# Patient Record
Sex: Female | Born: 1985 | Race: White | Hispanic: No | Marital: Married | Smoking: Never smoker
Health system: Southern US, Community
[De-identification: ages and names within clinical notes are randomized; demographics above are authoritative.]

## PROBLEM LIST (undated history)

## (undated) DIAGNOSIS — K5909 Other constipation: Secondary | ICD-10-CM

## (undated) DIAGNOSIS — N941 Unspecified dyspareunia: Secondary | ICD-10-CM

## (undated) DIAGNOSIS — I7300 Raynaud's syndrome without gangrene: Secondary | ICD-10-CM

## (undated) DIAGNOSIS — K589 Irritable bowel syndrome without diarrhea: Secondary | ICD-10-CM

## (undated) DIAGNOSIS — R2231 Localized swelling, mass and lump, right upper limb: Secondary | ICD-10-CM

## (undated) HISTORY — PX: FINGER SURGERY: SHX640

## (undated) HISTORY — DX: Raynaud's syndrome without gangrene: I73.00

## (undated) HISTORY — DX: Irritable bowel syndrome without diarrhea: K58.9

## (undated) HISTORY — DX: Unspecified dyspareunia: N94.10

## (undated) HISTORY — PX: WISDOM TOOTH EXTRACTION: SHX21

## (undated) HISTORY — DX: Other constipation: K59.09

---

## 2012-11-24 ENCOUNTER — Other Ambulatory Visit: Payer: Self-pay | Admitting: Orthopedic Surgery

## 2012-12-06 DIAGNOSIS — R2231 Localized swelling, mass and lump, right upper limb: Secondary | ICD-10-CM

## 2012-12-06 HISTORY — DX: Localized swelling, mass and lump, right upper limb: R22.31

## 2012-12-11 ENCOUNTER — Encounter (HOSPITAL_BASED_OUTPATIENT_CLINIC_OR_DEPARTMENT_OTHER): Payer: Self-pay | Admitting: *Deleted

## 2012-12-18 ENCOUNTER — Ambulatory Visit (HOSPITAL_BASED_OUTPATIENT_CLINIC_OR_DEPARTMENT_OTHER): Payer: 59 | Admitting: Anesthesiology

## 2012-12-18 ENCOUNTER — Encounter (HOSPITAL_BASED_OUTPATIENT_CLINIC_OR_DEPARTMENT_OTHER)
Admission: RE | Disposition: A | Discharge status: Home / Self Care | Payer: Self-pay | Source: Ambulatory Visit | Attending: Orthopedic Surgery

## 2012-12-18 ENCOUNTER — Encounter (HOSPITAL_BASED_OUTPATIENT_CLINIC_OR_DEPARTMENT_OTHER): Payer: Self-pay | Admitting: Anesthesiology

## 2012-12-18 ENCOUNTER — Encounter (HOSPITAL_BASED_OUTPATIENT_CLINIC_OR_DEPARTMENT_OTHER): Payer: Self-pay | Admitting: *Deleted

## 2012-12-18 ENCOUNTER — Ambulatory Visit (HOSPITAL_BASED_OUTPATIENT_CLINIC_OR_DEPARTMENT_OTHER)
Admission: RE | Admit: 2012-12-18 | Discharge: 2012-12-18 | Disposition: A | Discharge status: Home / Self Care | Payer: 59 | Source: Ambulatory Visit | Attending: Orthopedic Surgery | Admitting: Orthopedic Surgery

## 2012-12-18 DIAGNOSIS — M659 Unspecified synovitis and tenosynovitis, unspecified site: Secondary | ICD-10-CM | POA: Insufficient documentation

## 2012-12-18 DIAGNOSIS — L980 Pyogenic granuloma: Secondary | ICD-10-CM | POA: Insufficient documentation

## 2012-12-18 HISTORY — DX: Localized swelling, mass and lump, right upper limb: R22.31

## 2012-12-18 SURGERY — EXCISION METACARPAL MASS
Anesthesia: General | Site: Hand | Laterality: Right | Wound class: Clean

## 2012-12-18 MED ORDER — LACTATED RINGERS IV SOLN
INTRAVENOUS | Status: DC
  Administered 2012-12-18: 12:00:00 via INTRAVENOUS

## 2012-12-18 MED ORDER — OXYCODONE HCL 5 MG/5ML PO SOLN: 5.0000 mg | Freq: Once | ORAL | Status: DC | PRN

## 2012-12-18 MED ORDER — PROPOFOL 10 MG/ML IV BOLUS
INTRAVENOUS | Status: DC | PRN
  Administered 2012-12-18: 200 mg via INTRAVENOUS

## 2012-12-18 MED ORDER — CHLORHEXIDINE GLUCONATE 4 % EX LIQD: 60.0000 mL | Freq: Once | CUTANEOUS | Status: DC

## 2012-12-18 MED ORDER — FENTANYL CITRATE 0.05 MG/ML IJ SOLN: 50.0000 ug | INTRAMUSCULAR | Status: DC | PRN

## 2012-12-18 MED ORDER — MIDAZOLAM HCL 2 MG/ML PO SYRP: 12.0000 mg | ORAL_SOLUTION | Freq: Once | ORAL | Status: DC | PRN

## 2012-12-18 MED ORDER — MIDAZOLAM HCL 5 MG/5ML IJ SOLN
INTRAMUSCULAR | Status: DC | PRN
  Administered 2012-12-18: 2 mg via INTRAVENOUS

## 2012-12-18 MED ORDER — ONDANSETRON HCL 4 MG/2ML IJ SOLN
INTRAMUSCULAR | Status: DC | PRN
  Administered 2012-12-18: 4 mg via INTRAVENOUS

## 2012-12-18 MED ORDER — FENTANYL CITRATE 0.05 MG/ML IJ SOLN
INTRAMUSCULAR | Status: DC | PRN
  Administered 2012-12-18: 50 ug via INTRAVENOUS

## 2012-12-18 MED ORDER — PROMETHAZINE HCL 25 MG/ML IJ SOLN: 6.2500 mg | INTRAMUSCULAR | Status: DC | PRN

## 2012-12-18 MED ORDER — OXYCODONE HCL 5 MG PO TABS: 5.0000 mg | ORAL_TABLET | Freq: Once | ORAL | Status: DC | PRN

## 2012-12-18 MED ORDER — 0.9 % SODIUM CHLORIDE (POUR BTL) OPTIME
TOPICAL | Status: DC | PRN
  Administered 2012-12-18: 250 mL

## 2012-12-18 MED ORDER — MIDAZOLAM HCL 2 MG/2ML IJ SOLN: 1.0000 mg | INTRAMUSCULAR | Status: DC | PRN

## 2012-12-18 MED ORDER — HYDROCODONE-ACETAMINOPHEN 5-325 MG PO TABS: 1.0000 | ORAL_TABLET | Freq: Four times a day (QID) | ORAL | Status: DC | PRN

## 2012-12-18 MED ORDER — DEXAMETHASONE SODIUM PHOSPHATE 4 MG/ML IJ SOLN
INTRAMUSCULAR | Status: DC | PRN
  Administered 2012-12-18: 8 mg via INTRAVENOUS

## 2012-12-18 MED ORDER — CEFAZOLIN SODIUM-DEXTROSE 2-3 GM-% IV SOLR
2.0000 g | INTRAVENOUS | Status: AC
  Administered 2012-12-18: 2 g via INTRAVENOUS

## 2012-12-18 MED ORDER — BUPIVACAINE HCL (PF) 0.25 % IJ SOLN
INTRAMUSCULAR | Status: DC | PRN
  Administered 2012-12-18: 8 mL

## 2012-12-18 MED ORDER — LIDOCAINE HCL (CARDIAC) 20 MG/ML IV SOLN
INTRAVENOUS | Status: DC | PRN
  Administered 2012-12-18: 50 mg via INTRAVENOUS

## 2012-12-18 MED ORDER — HYDROMORPHONE HCL PF 1 MG/ML IJ SOLN: 0.2500 mg | INTRAMUSCULAR | Status: DC | PRN

## 2012-12-18 MED ORDER — EPHEDRINE SULFATE 50 MG/ML IJ SOLN
INTRAMUSCULAR | Status: DC | PRN
  Administered 2012-12-18: 10 mg via INTRAVENOUS

## 2012-12-18 SURGICAL SUPPLY — 52 items
BANDAGE COBAN STERILE 2 (GAUZE/BANDAGES/DRESSINGS) IMPLANT
BANDAGE GAUZE ELAST BULKY 4 IN (GAUZE/BANDAGES/DRESSINGS) IMPLANT
BLADE MINI RND TIP GREEN BEAV (BLADE) ×2 IMPLANT
BLADE SURG 15 STRL LF DISP TIS (BLADE) ×1 IMPLANT
BLADE SURG 15 STRL SS (BLADE) ×1
BNDG COHESIVE 1X5 TAN STRL LF (GAUZE/BANDAGES/DRESSINGS) ×2 IMPLANT
BNDG COHESIVE 3X5 TAN STRL LF (GAUZE/BANDAGES/DRESSINGS) IMPLANT
BNDG ESMARK 4X9 LF (GAUZE/BANDAGES/DRESSINGS) ×2 IMPLANT
CHLORAPREP W/TINT 26ML (MISCELLANEOUS) ×2 IMPLANT
CLOTH BEACON ORANGE TIMEOUT ST (SAFETY) ×2 IMPLANT
CORDS BIPOLAR (ELECTRODE) ×2 IMPLANT
COVER MAYO STAND STRL (DRAPES) ×2 IMPLANT
COVER TABLE BACK 60X90 (DRAPES) ×2 IMPLANT
CUFF TOURNIQUET SINGLE 18IN (TOURNIQUET CUFF) ×2 IMPLANT
DECANTER SPIKE VIAL GLASS SM (MISCELLANEOUS) IMPLANT
DRAIN PENROSE 1/2X12 LTX STRL (WOUND CARE) IMPLANT
DRAPE EXTREMITY T 121X128X90 (DRAPE) ×2 IMPLANT
DRAPE SURG 17X23 STRL (DRAPES) ×2 IMPLANT
GAUZE XEROFORM 1X8 LF (GAUZE/BANDAGES/DRESSINGS) ×2 IMPLANT
GLOVE BIO SURGEON STRL SZ 6.5 (GLOVE) ×2 IMPLANT
GLOVE BIOGEL PI IND STRL 7.0 (GLOVE) ×1 IMPLANT
GLOVE BIOGEL PI IND STRL 8.5 (GLOVE) ×1 IMPLANT
GLOVE BIOGEL PI INDICATOR 7.0 (GLOVE) ×1
GLOVE BIOGEL PI INDICATOR 8.5 (GLOVE) ×1
GLOVE SURG ORTHO 8.0 STRL STRW (GLOVE) ×2 IMPLANT
GOWN BRE IMP PREV XXLGXLNG (GOWN DISPOSABLE) ×2 IMPLANT
GOWN PREVENTION PLUS XLARGE (GOWN DISPOSABLE) ×2 IMPLANT
NDL SAFETY ECLIPSE 18X1.5 (NEEDLE) IMPLANT
NEEDLE 27GAX1X1/2 (NEEDLE) ×2 IMPLANT
NEEDLE HYPO 18GX1.5 SHARP (NEEDLE)
NS IRRIG 1000ML POUR BTL (IV SOLUTION) ×2 IMPLANT
PACK BASIN DAY SURGERY FS (CUSTOM PROCEDURE TRAY) ×2 IMPLANT
PAD CAST 3X4 CTTN HI CHSV (CAST SUPPLIES) IMPLANT
PADDING CAST ABS 3INX4YD NS (CAST SUPPLIES)
PADDING CAST ABS 4INX4YD NS (CAST SUPPLIES) ×1
PADDING CAST ABS COTTON 3X4 (CAST SUPPLIES) IMPLANT
PADDING CAST ABS COTTON 4X4 ST (CAST SUPPLIES) ×1 IMPLANT
PADDING CAST COTTON 3X4 STRL (CAST SUPPLIES)
SPLINT FNGR PLAIN END 5/8X3.25 (CAST SUPPLIES) ×1 IMPLANT
SPLINT PLASTALUME 3 1/4 (CAST SUPPLIES) ×2
SPLINT PLASTER CAST XFAST 3X15 (CAST SUPPLIES) IMPLANT
SPLINT PLASTER XTRA FASTSET 3X (CAST SUPPLIES)
SPONGE GAUZE 4X4 12PLY (GAUZE/BANDAGES/DRESSINGS) ×2 IMPLANT
STOCKINETTE 4X48 STRL (DRAPES) ×2 IMPLANT
SUT MERSILENE 5 0 P 3 (SUTURE) ×2 IMPLANT
SUT VIC AB 4-0 P2 18 (SUTURE) IMPLANT
SUT VICRYL RAPID 5 0 P 3 (SUTURE) ×2 IMPLANT
SUT VICRYL RAPIDE 4/0 PS 2 (SUTURE) IMPLANT
SYR BULB 3OZ (MISCELLANEOUS) ×2 IMPLANT
SYR CONTROL 10ML LL (SYRINGE) ×2 IMPLANT
TOWEL OR 17X24 6PK STRL BLUE (TOWEL DISPOSABLE) ×2 IMPLANT
UNDERPAD 30X30 INCONTINENT (UNDERPADS AND DIAPERS) ×2 IMPLANT

## 2012-12-18 NOTE — Brief Op Note (Signed)
12/18/2012  1:45 PM  PATIENT:  Maria Cohen  27 y.o. female  PRE-OPERATIVE DIAGNOSIS:  MASS RIGHT MIDDLE FINGER  POST-OPERATIVE DIAGNOSIS:  MASS RIGHT MIDDLE FINGER  PROCEDURE:  Procedure(s): EXCISION MASS RIGHT MIDDLE FINGER, POSSIBLE DEBRIDEMENT PROXIMAL INTERPHALANGEAL (Right)  SURGEON:  Surgeon(s) and Role:    * Nicki Reaper, MD - Primary  PHYSICIAN ASSISTANT:   ASSISTANTS: none   ANESTHESIA:   local and general  EBL:     BLOOD ADMINISTERED:none  DRAINS: none   LOCAL MEDICATIONS USED:  MARCAINE     SPECIMEN:  Excision  DISPOSITION OF SPECIMEN:  PATHOLOGY  COUNTS:  YES  TOURNIQUET:   Total Tourniquet Time Documented: Upper Arm (Right) - 22 minutes Total: Upper Arm (Right) - 22 minutes   DICTATION: .Other Dictation: Dictation Number [id number]  PLAN OF CARE: Discharge to home after PACU  PATIENT DISPOSITION:  PACU - hemodynamically stable.

## 2012-12-18 NOTE — Anesthesia Preprocedure Evaluation (Signed)
Anesthesia Evaluation  Patient identified by MRN, date of birth, ID band Patient awake    Reviewed: Allergy & Precautions, H&P , NPO status , Patient's Chart, lab work & pertinent test results  Airway Mallampati: I TM Distance: >3 FB Neck ROM: Full    Dental   Pulmonary  breath sounds clear to auscultation        Cardiovascular Rhythm:Regular Rate:Normal     Neuro/Psych    GI/Hepatic   Endo/Other    Renal/GU      Musculoskeletal   Abdominal   Peds  Hematology   Anesthesia Other Findings   Reproductive/Obstetrics                           Anesthesia Physical Anesthesia Plan  ASA: I  Anesthesia Plan: General   Post-op Pain Management:    Induction: Intravenous  Airway Management Planned: LMA  Additional Equipment:   Intra-op Plan:   Post-operative Plan: Extubation in OR  Informed Consent: I have reviewed the patients History and Physical, chart, labs and discussed the procedure including the risks, benefits and alternatives for the proposed anesthesia with the patient or authorized representative who has indicated his/her understanding and acceptance.     Plan Discussed with: CRNA and Surgeon  Anesthesia Plan Comments:         Anesthesia Quick Evaluation  

## 2012-12-18 NOTE — Anesthesia Postprocedure Evaluation (Signed)
  Anesthesia Post-op Note  Patient: Maria Cohen  Procedure(s) Performed: Procedure(s): EXCISION MASS RIGHT MIDDLE FINGER, POSSIBLE DEBRIDEMENT PROXIMAL INTERPHALANGEAL (Right)  Patient Location: PACU  Anesthesia Type:General  Level of Consciousness: awake and alert   Airway and Oxygen Therapy: Patient Spontanous Breathing  Post-op Pain: mild  Post-op Assessment: Post-op Vital signs reviewed, Patient's Cardiovascular Status Stable, Respiratory Function Stable, Patent Airway, No signs of Nausea or vomiting and Pain level controlled  Post-op Vital Signs: stable  Complications: No apparent anesthesia complications

## 2012-12-18 NOTE — Transfer of Care (Signed)
Immediate Anesthesia Transfer of Care Note  Patient: Maria Cohen  Procedure(s) Performed: Procedure(s): EXCISION MASS RIGHT MIDDLE FINGER, POSSIBLE DEBRIDEMENT PROXIMAL INTERPHALANGEAL (Right)  Patient Location: PACU  Anesthesia Type:General  Level of Consciousness: awake, alert  and oriented  Airway & Oxygen Therapy: Patient Spontanous Breathing and Patient connected to face mask oxygen  Post-op Assessment: Report given to PACU RN and Post -op Vital signs reviewed and stable  Post vital signs: Reviewed and stable  Complications: No apparent anesthesia complications

## 2012-12-18 NOTE — H&P (Signed)
Maria Cohen is a 27 year-old right-hand dominant female  with a mass in the dorsal aspect of her right middle finger. This has been present since January.  She recalls no history of injury, but states that her finger was swollen at the time, became stiff.  She has been worked up for [employer], laboratory work has been negative.  She has no history of diabetes, thyroid problems, arthritis or gout.  There is a family history of diabetes.  She has been taking meloxicam with some relief.  She complains of the swelling and mass over the PIP joint of her right middle finger. 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    ALLERGIES:     None.  MEDICATIONS:     Birth control, meloxicam.  SURGICAL HISTORY:    None.  FAMILY MEDICAL HISTORY:  Positive for arthritis.      SOCIAL HISTORY:     She does not smoke, drinks socially, she is married and works as a Char[employer] fundraiser.  REVIEW OF SYSTEMS:    Positive for contacts, the nodules, otherwise negative 14 points. Maria Cohen is an 27 y.o. female.   Chief Complaint: mass RMF HPI: see above  Past Medical History  Diagnosis Date  . Mass of finger of right hand 12/2012    middle finger    Past Surgical History  Procedure Laterality Date  . Wisdom tooth extraction      History reviewed. No pertinent family history. Social History:  reports that she has never smoked. She has never used smokeless tobacco. She reports that  drinks alcohol. She reports that she does not use illicit drugs.  Allergies: No Known Allergies  Medications Prior to Admission  Medication Sig Dispense Refill  . fish oil-omega-3 fatty acids 1000 MG capsule Take 2 g by mouth daily.      . meloxicam (MOBIC) 15 MG tablet Take 15 mg by mouth daily.      . norethindrone-ethinyl estradiol (OVCON-35,BALZIVA,BRIELLYN) 0.4-35 MG-MCG tablet Take 1 tablet by mouth daily.        Results for orders placed during the hospital  encounter of 12/18/12 (from the past 48 hour(s))  POCT HEMOGLOBIN-HEMACUE     Status: None   Collection Time    12/18/12 11:34 AM      Result Value Range   Hemoglobin 12.7  12.0 - 15.0 g/dL    No results found.   Pertinent items are noted in HPI.  Blood pressure 102/68, pulse 69, temperature 98.4 F (36.9 C), temperature source Oral, resp. rate 20, height 5\' 5"  (1.651 m), weight 65.772 kg (145 lb), last menstrual period 12/07/2012, SpO2 100.00%.  General appearance: alert, cooperative and appears stated age Head: Normocephalic, without obvious abnormality Neck: no JVD Resp: clear to auscultation bilaterally Cardio: regular rate and rhythm, S1, S2 normal, no murmur, click, rub or gallop GI: soft, non-tender; bowel sounds normal; no masses,  no organomegaly Extremities: extremities normal, atraumatic, no cyanosis or edema Pulses: 2+ and symmetric Skin: Skin color, texture, turgor normal. No rashes or lesions Neurologic: Grossly normal Incision/Wound: na  Assessment/Plan RADIOGRAPHS:     X-rays are negative.  RECOMMENDATIONS/PLAN:   We have discussed this with her.  She would like to have this biopsied.  She is aware of the risks and complications including infection, recurrence, injury to arteries, nerves, tendons.  She would like to proceed with the possibility of debridement of  the joint should this be from the joint.    She is scheduled for excision mass right middle finger as an outpatient under regional anesthesia.  Minami Arriaga R 12/18/2012, 12:46 PM

## 2012-12-18 NOTE — Anesthesia Procedure Notes (Signed)
Procedure Name: LMA Insertion Date/Time: 12/18/2012 1:09 PM Performed by: Zenia Resides D Pre-anesthesia Checklist: Patient identified, Emergency Drugs available, Suction available and Patient being monitored Patient Re-evaluated:Patient Re-evaluated prior to inductionOxygen Delivery Method: Circle System Utilized Preoxygenation: Pre-oxygenation with 100% oxygen Intubation Type: IV induction Ventilation: Mask ventilation without difficulty LMA: LMA inserted LMA Size: 4.0 Number of attempts: 1 Airway Equipment and Method: bite block Placement Confirmation: positive ETCO2 Tube secured with: Tape Dental Injury: Teeth and Oropharynx as per pre-operative assessment

## 2012-12-18 NOTE — Op Note (Signed)
Dictation Number [id number]

## 2012-12-19 NOTE — Op Note (Signed)
NAME:  Maria Cohen, KIHN NO.:  [id number]  MEDICAL RECORD NO.:  [id number]  LOCATION:                                 FACILITY:  PHYSICIAN:  Cindee Salt, M.D.            DATE OF BIRTH:  DATE OF PROCEDURE:  12/18/2012 DATE OF DISCHARGE:                              OPERATIVE REPORT   PREOPERATIVE DIAGNOSIS:  Mass, dorsal aspect right middle finger.  POSTOPERATIVE DIAGNOSIS:  Mass, dorsal aspect right middle finger.  OPERATION:  Excision of mass, synovectomy PIP joint, right middle finger.  SURGEON:  Cindee Salt, M.D.  ANESTHESIA:  General with metacarpal block.  ANESTHESIOLOGIST:  Bedelia Person, M.D.  HISTORY:  The patient is a 27 year old female with a history of mass over the PIP joint right middle finger.  This has become painful for her.  It has been present for approximately 4 months and enlarging.  She desirous of having this excised.  She is aware that there are the risks and complications of surgery including infection, recurrence injury to arteries, nerves, tendons, incomplete relief of symptoms, dystrophy, possibility of stiffness to the joint.  In the preoperative area, the patient was seen, the extremity was marked by both patient and surgeon. Antibiotic given.  DESCRIPTION OF PROCEDURE:  The patient was brought to the operating room where a general anesthetic was carried out without difficulty.  She was prepped using ChloraPrep in supine position with the right arm free.  A 3-minute dry time was allowed.  Time-out taken, confirmed the patient and procedure.  The limb was exsanguinated with an Esmarch bandage. Tourniquet placed on the upper arm, it was inflated to 250 mmHg.  A curvilinear incision was made over the PIP joint.  The proximal middle phalanges of the right middle finger carried down through subcutaneous tissue.  The mass was immediately encountered directly under the skin and this was excised with blunt and sharp dissection, sent to  Pathology. The joint was opened and that there was significant swelling of this. This was done on the radial aspect between the lateral and central slip. A complete synovectomy to the dorsal aspect of the joint was performed. The specimen was sent.  The wound was copiously irrigated with saline. The entrance into the joint through the extensor tendon was repaired with figure-of-eight 5-0 Mersilene sutures, and the skin closed with interrupted 5-0 Vicryl Rapide sutures.  A metacarpal block was then given with 0.25% Marcaine without epinephrine, 8 mL was used.  Sterile compressive dressing and splint to the finger applied.  On deflation of the tourniquet, remaining fingers pinked.  She was taken to the recovery room for observation in satisfactory condition.  She will be discharged home to return in 1 week on Vicodin.          ______________________________ Cindee Salt, M.D.     GK/MEDQ  D:  12/18/2012  T:  12/19/2012  Job:  [id number]  cc:   Debbora Dus, MD

## 2014-09-19 LAB — HM PAP SMEAR: HM PAP: NEGATIVE

## 2015-08-14 DIAGNOSIS — K5900 Constipation, unspecified: Secondary | ICD-10-CM | POA: Diagnosis not present

## 2015-09-27 DIAGNOSIS — Z01419 Encounter for gynecological examination (general) (routine) without abnormal findings: Secondary | ICD-10-CM | POA: Diagnosis not present

## 2015-09-27 DIAGNOSIS — Z3169 Encounter for other general counseling and advice on procreation: Secondary | ICD-10-CM | POA: Diagnosis not present

## 2015-09-27 DIAGNOSIS — Z3041 Encounter for surveillance of contraceptive pills: Secondary | ICD-10-CM | POA: Diagnosis not present

## 2015-10-10 MED FILL — LINZESS 145 MCG CAPSULE: 145 | 30 days supply | Qty: 30 | Fill #0

## 2015-11-06 MED FILL — LINZESS 145 MCG CAPSULE: 145 | 30 days supply | Qty: 30 | Fill #1

## 2015-12-06 MED FILL — LINZESS 145 MCG CAPSULE: 145 | 30 days supply | Qty: 30 | Fill #2

## 2016-01-03 MED FILL — LINZESS 145 MCG CAPSULE: 145 | 30 days supply | Qty: 30 | Fill #0 | Status: TO

## 2016-06-18 DIAGNOSIS — M2550 Pain in unspecified joint: Secondary | ICD-10-CM | POA: Diagnosis not present

## 2016-06-18 DIAGNOSIS — M064 Inflammatory polyarthropathy: Secondary | ICD-10-CM | POA: Diagnosis not present

## 2016-06-18 DIAGNOSIS — Z6823 Body mass index (BMI) 23.0-23.9, adult: Secondary | ICD-10-CM | POA: Diagnosis not present

## 2016-09-10 ENCOUNTER — Telehealth: Payer: Self-pay

## 2016-09-10 ENCOUNTER — Other Ambulatory Visit: Payer: Self-pay | Admitting: Obstetrics and Gynecology

## 2016-09-10 ENCOUNTER — Other Ambulatory Visit: Payer: Self-pay

## 2016-09-10 DIAGNOSIS — Z3041 Encounter for surveillance of contraceptive pills: Secondary | ICD-10-CM

## 2016-09-10 MED ORDER — NORETHINDRONE-ETH ESTRADIOL 0.4-35 MG-MCG PO TABS: 1.0000 | ORAL_TABLET | Freq: Every day | ORAL | 1 refills | Status: DC

## 2016-09-10 NOTE — Telephone Encounter (Signed)
LVM with information. Pt to fu prn.

## 2016-09-10 NOTE — Telephone Encounter (Signed)
Pt states this is her 3rd request for her BC refill, pt has appt in April, please send in refill and call to let her know its sent please cb [phone]

## 2016-09-10 NOTE — Telephone Encounter (Signed)
RN to notify pt we never got other RF requests. Rx eRxd just now.

## 2016-10-24 ENCOUNTER — Encounter: Payer: Self-pay | Admitting: Obstetrics and Gynecology

## 2016-10-24 ENCOUNTER — Ambulatory Visit (INDEPENDENT_AMBULATORY_CARE_PROVIDER_SITE_OTHER): Payer: 59 | Admitting: Obstetrics and Gynecology

## 2016-10-24 VITALS — BP 104/66 | HR 76 | Ht 65.0 in | Wt 139.0 lb

## 2016-10-24 DIAGNOSIS — Z3041 Encounter for surveillance of contraceptive pills: Secondary | ICD-10-CM

## 2016-10-24 DIAGNOSIS — Z01419 Encounter for gynecological examination (general) (routine) without abnormal findings: Secondary | ICD-10-CM

## 2016-10-24 NOTE — Progress Notes (Signed)
Chief Complaint  Patient presents with  . Gynecologic Exam     HPI:      [name] is a 31 y.o. G0P0000 who LMP was Patient's last menstrual period was 10/07/2016 (exact date)., presents today for her annual examination.  Her menses are regular every 28-30 days, lasting 5 days.  Dysmenorrhea mild, occurring first 1-2 days of flow. She does not have intermenstrual bleeding.  Sex activity: single partner, contraception - OCP (estrogen/progesterone). She may want to conceive 2019. Last Pap: September 19, 2014  Results were: no abnormalities /neg HPV DNA  Hx of STDs: none  There is a FH of breast cancer in her MGM, genetic testing not indicated. There is no FH of ovarian cancer. The patient does do self-breast exams.  Tobacco use: The patient denies current or previous tobacco use. Alcohol use: social drinker Exercise: very active  She does get adequate calcium and Vitamin D in her diet.   Past Medical History:  Diagnosis Date  . Dyspareunia in female   . IBS (irritable bowel syndrome)   . Mass of finger of right hand 12/2012   middle finger    Past Surgical History:  Procedure Laterality Date  . FINGER SURGERY     Rheumatoid nodule  . WISDOM TOOTH EXTRACTION      Family History  Problem Relation Age of Onset  . Osteopenia Mother   . COPD Father   . Lung cancer Father   . Breast cancer Maternal Grandmother 2    Social History   Social History  . Marital status: Married    Spouse name: N/A  . Number of children: N/A  . Years of education: N/A   Occupational History  . Not on file.   Social History Main Topics  . Smoking status: Never Smoker  . Smokeless tobacco: Never Used  . Alcohol use Yes     Comment: ocasionally  . Drug use: No  . Sexual activity: Yes    Birth control/ protection: Pill   Other Topics Concern  . Not on file   Social History Narrative  . No narrative on file     Current Outpatient Prescriptions:  .  BALZIVA 0.4-35  MG-MCG tablet, Take 1 tablet by mouth daily., Disp: , Rfl: 1  ROS:  Review of Systems  Constitutional: Negative for fever, malaise/fatigue and weight loss.  HENT: Negative for congestion, ear pain and sinus pain.   Respiratory: Negative for cough, shortness of breath and wheezing.   Cardiovascular: Negative for chest pain, orthopnea and leg swelling.  Gastrointestinal: Negative for constipation, diarrhea, nausea and vomiting.  Genitourinary: Negative for dysuria, frequency, hematuria and urgency.       Breast ROS: negative   Musculoskeletal: Negative for back pain, joint pain and myalgias.  Skin: Negative for itching and rash.  Neurological: Negative for dizziness, tingling, focal weakness and headaches.  Endo/Heme/Allergies: Negative for environmental allergies. Does not bruise/bleed easily.  Psychiatric/Behavioral: Negative for depression and suicidal ideas. The patient is not nervous/anxious and does not have insomnia.     Objective: BP 104/66 (BP Location: Left Arm, Patient Position: Sitting, Cuff Size: Normal)   Pulse 76   Ht 5\' 5"  (1.651 m)   Wt 139 lb (63 kg)   LMP 10/07/2016 (Exact Date)   BMI 23.13 kg/m    Physical Exam  Constitutional: She is oriented to person, place, and time. She appears well-developed and well-nourished.  Genitourinary: Vagina normal and uterus normal. No erythema or tenderness in  the vagina. No vaginal discharge found. Right adnexum does not display mass and does not display tenderness. Left adnexum does not display mass and does not display tenderness. Cervix does not exhibit motion tenderness or polyp. Uterus is not enlarged or tender.  Neck: Normal range of motion. No thyromegaly present.  Cardiovascular: Normal rate, regular rhythm and normal heart sounds.   No murmur heard. Pulmonary/Chest: Effort normal and breath sounds normal. Right breast exhibits no mass, no nipple discharge, no skin change and no tenderness. Left breast exhibits no mass,  no nipple discharge, no skin change and no tenderness.  Abdominal: Soft. There is no tenderness. There is no guarding.  Musculoskeletal: Normal range of motion.  Neurological: She is alert and oriented to person, place, and time. No cranial nerve deficit.  Psychiatric: She has a normal mood and affect. Her behavior is normal.  Vitals reviewed.   Assessment/Plan: Encounter for annual routine gynecological examination  Encounter for surveillance of contraceptive pills - OCP RF. Start PNVs 3 months before stopping OCPs.             GYN counsel adequate intake of calcium and vitamin D, diet and exercise     F/U  Return in about 1 year (around 10/24/2017).  Ayauna Mcnay B. Aspin Palomarez, PA-C 10/24/2016 5:09 PM

## 2016-11-03 ENCOUNTER — Other Ambulatory Visit: Payer: Self-pay | Admitting: Obstetrics and Gynecology

## 2016-11-03 DIAGNOSIS — Z3041 Encounter for surveillance of contraceptive pills: Secondary | ICD-10-CM

## 2017-10-07 ENCOUNTER — Other Ambulatory Visit: Payer: Self-pay | Admitting: Occupational Medicine

## 2017-10-08 LAB — CBC WITH DIFFERENTIAL/PLATELET
BASOS ABS: 39 {cells}/uL (ref 0–200)
Basophils Relative: 0.7 %
EOS ABS: 50 {cells}/uL (ref 15–500)
Eosinophils Relative: 0.9 %
HCT: 35.4 % (ref 35.0–45.0)
Hemoglobin: 12.4 g/dL (ref 11.7–15.5)
Lymphs Abs: 1865 cells/uL (ref 850–3900)
MCH: 33 pg (ref 27.0–33.0)
MCHC: 35 g/dL (ref 32.0–36.0)
MCV: 94.1 fL (ref 80.0–100.0)
MONOS PCT: 6.9 %
MPV: 10.5 fL (ref 7.5–12.5)
NEUTROS ABS: 3168 {cells}/uL (ref 1500–7800)
Neutrophils Relative %: 57.6 %
PLATELETS: 307 10*3/uL (ref 140–400)
RBC: 3.76 10*6/uL — ABNORMAL LOW (ref 3.80–5.10)
RDW: 12.1 % (ref 11.0–15.0)
Total Lymphocyte: 33.9 %
WBC mixed population: 380 cells/uL (ref 200–950)
WBC: 5.5 10*3/uL (ref 3.8–10.8)

## 2017-10-08 LAB — COMPLETE METABOLIC PANEL WITH GFR
AG Ratio: 1.8 (calc) (ref 1.0–2.5)
ALKALINE PHOSPHATASE (APISO): 25 U/L — AB (ref 33–115)
ALT: 6 U/L (ref 6–29)
AST: 14 U/L (ref 10–30)
Albumin: 4.2 g/dL (ref 3.6–5.1)
BILIRUBIN TOTAL: 0.5 mg/dL (ref 0.2–1.2)
BUN: 12 mg/dL (ref 7–25)
CHLORIDE: 106 mmol/L (ref 98–110)
CO2: 25 mmol/L (ref 20–32)
Calcium: 9.3 mg/dL (ref 8.6–10.2)
Creat: 0.96 mg/dL (ref 0.50–1.10)
GFR, EST AFRICAN AMERICAN: 90 mL/min/{1.73_m2} (ref >=60)
GFR, Est Non African American: 78 mL/min/{1.73_m2} (ref >=60)
GLUCOSE: 100 mg/dL — AB (ref 65–99)
Globulin: 2.4 g/dL (calc) (ref 1.9–3.7)
Potassium: 4.8 mmol/L (ref 3.5–5.3)
Sodium: 138 mmol/L (ref 135–146)
TOTAL PROTEIN: 6.6 g/dL (ref 6.1–8.1)

## 2017-10-08 LAB — LIPID PANEL W/REFLEX DIRECT LDL
Cholesterol: 177 mg/dL (ref <200)
HDL: 78 mg/dL (ref >50)
LDL CHOLESTEROL (CALC): 79 mg/dL
NON-HDL CHOLESTEROL (CALC): 99 mg/dL (ref <130)
TRIGLYCERIDES: 117 mg/dL (ref <150)
Total CHOL/HDL Ratio: 2.3 (calc) (ref <5.0)

## 2017-10-08 LAB — TSH: TSH: 1.61 m[IU]/L

## 2017-10-15 ENCOUNTER — Other Ambulatory Visit: Payer: Self-pay | Admitting: Obstetrics and Gynecology

## 2017-10-15 DIAGNOSIS — Z3041 Encounter for surveillance of contraceptive pills: Secondary | ICD-10-CM

## 2017-11-10 ENCOUNTER — Other Ambulatory Visit: Payer: Self-pay | Admitting: Obstetrics and Gynecology

## 2017-11-10 DIAGNOSIS — Z3041 Encounter for surveillance of contraceptive pills: Secondary | ICD-10-CM

## 2017-11-12 ENCOUNTER — Ambulatory Visit (INDEPENDENT_AMBULATORY_CARE_PROVIDER_SITE_OTHER): Payer: 59 | Admitting: Obstetrics and Gynecology

## 2017-11-12 ENCOUNTER — Encounter: Payer: Self-pay | Admitting: Obstetrics and Gynecology

## 2017-11-12 VITALS — BP 102/70 | HR 69 | Ht 65.0 in | Wt 142.0 lb

## 2017-11-12 DIAGNOSIS — Z3041 Encounter for surveillance of contraceptive pills: Secondary | ICD-10-CM

## 2017-11-12 DIAGNOSIS — Z1151 Encounter for screening for human papillomavirus (HPV): Secondary | ICD-10-CM

## 2017-11-12 DIAGNOSIS — Z01419 Encounter for gynecological examination (general) (routine) without abnormal findings: Secondary | ICD-10-CM | POA: Diagnosis not present

## 2017-11-12 DIAGNOSIS — Z124 Encounter for screening for malignant neoplasm of cervix: Secondary | ICD-10-CM | POA: Diagnosis not present

## 2017-11-12 MED ORDER — NORETHINDRONE-ETH ESTRADIOL 0.4-35 MG-MCG PO TABS: 1.0000 | ORAL_TABLET | Freq: Every day | ORAL | 3 refills | Status: DC

## 2017-11-12 NOTE — Patient Instructions (Signed)
I value your feedback and entrusting us with your care. If you get a [facility] patient survey, I would appreciate you taking the time to let us know about your experience today. Thank you! 

## 2017-11-12 NOTE — Progress Notes (Signed)
Chief Complaint  Patient presents with  . Gynecologic Exam     HPI:      [name] is a 32 y.o. G0P0000 who LMP was Patient's last menstrual period was 11/03/2017 (exact date)., presents today for her annual examination.  Her menses are regular every 28-30 days, lasting 5 days.  Dysmenorrhea mild, occurring first 1 day of flow, improved with ibup. She does not have intermenstrual bleeding.  Sex activity: single partner, contraception - OCP (estrogen/progesterone). She may want to conceive 2019. Last Pap: September 19, 2014  Results were: no abnormalities /neg HPV DNA  Hx of STDs: none  There is a FH of breast cancer in her MGM, genetic testing not indicated. There is no FH of ovarian cancer. The patient does do self-breast exams.  Tobacco use: The patient denies current or previous tobacco use. Alcohol use: social drinker Exercise: very active  She does get adequate calcium and Vitamin D in her diet.    Past Medical History:  Diagnosis Date  . Dyspareunia in female   . IBS (irritable bowel syndrome)   . Mass of finger of right hand 12/2012   middle finger    Past Surgical History:  Procedure Laterality Date  . FINGER SURGERY     Rheumatoid nodule  . WISDOM TOOTH EXTRACTION      Family History  Problem Relation Age of Onset  . Osteopenia Mother   . COPD Father   . Lung cancer Father   . Breast cancer Maternal Grandmother 39    Social History   Socioeconomic History  . Marital status: Married    Spouse name: Not on file  . Number of children: Not on file  . Years of education: Not on file  . Highest education level: Not on file  Occupational History  . Not on file  Social Needs  . Financial resource strain: Not on file  . Food insecurity:    Worry: Not on file    Inability: Not on file  . Transportation needs:    Medical: Not on file    Non-medical: Not on file  Tobacco Use  . Smoking status: Never Smoker  . Smokeless tobacco: Never  Used  Substance and Sexual Activity  . Alcohol use: Yes    Comment: ocasionally  . Drug use: No  . Sexual activity: Yes    Birth control/protection: Pill  Lifestyle  . Physical activity:    Days per week: Not on file    Minutes per session: Not on file  . Stress: Not on file  Relationships  . Social connections:    Talks on phone: Not on file    Gets together: Not on file    Attends religious service: Not on file    Active member of club or organization: Not on file    Attends meetings of clubs or organizations: Not on file    Relationship status: Not on file  . Intimate partner violence:    Fear of current or ex partner: Not on file    Emotionally abused: Not on file    Physically abused: Not on file    Forced sexual activity: Not on file  Other Topics Concern  . Not on file  Social History Narrative  . Not on file    No current outpatient medications on file prior to visit.   No current facility-administered medications on file prior to visit.       ROS:  Review of Systems  Constitutional: Negative for fatigue, fever and unexpected weight change.  Respiratory: Negative for cough, shortness of breath and wheezing.   Cardiovascular: Negative for chest pain, palpitations and leg swelling.  Gastrointestinal: Negative for blood in stool, constipation, diarrhea, nausea and vomiting.  Endocrine: Negative for cold intolerance, heat intolerance and polyuria.  Genitourinary: Negative for dyspareunia, dysuria, flank pain, frequency, genital sores, hematuria, menstrual problem, pelvic pain, urgency, vaginal bleeding, vaginal discharge and vaginal pain.  Musculoskeletal: Negative for back pain, joint swelling and myalgias.  Skin: Negative for rash.  Neurological: Negative for dizziness, syncope, light-headedness, numbness and headaches.  Hematological: Negative for adenopathy.  Psychiatric/Behavioral: Negative for agitation, confusion, sleep disturbance and suicidal ideas. The  patient is not nervous/anxious.      Objective: BP 102/70   Pulse 69   Ht 5\' 5"  (1.651 m)   Wt 142 lb (64.4 kg)   LMP 11/03/2017 (Exact Date)   BMI 23.63 kg/m    Physical Exam  Constitutional: She is oriented to person, place, and time. She appears well-developed and well-nourished.  Genitourinary: Vagina normal and uterus normal. There is no rash or tenderness on the right labia. There is no rash or tenderness on the left labia. No erythema or tenderness in the vagina. No vaginal discharge found. Right adnexum does not display mass and does not display tenderness. Left adnexum does not display mass and does not display tenderness. Cervix does not exhibit motion tenderness or polyp. Uterus is not enlarged or tender.  Neck: Normal range of motion. No thyromegaly present.  Cardiovascular: Normal rate, regular rhythm and normal heart sounds.  No murmur heard. Pulmonary/Chest: Effort normal and breath sounds normal. Right breast exhibits no mass, no nipple discharge, no skin change and no tenderness. Left breast exhibits no mass, no nipple discharge, no skin change and no tenderness.  Abdominal: Soft. There is no tenderness. There is no guarding.  Musculoskeletal: Normal range of motion.  Neurological: She is alert and oriented to person, place, and time. No cranial nerve deficit.  Psychiatric: She has a normal mood and affect. Her behavior is normal.  Vitals reviewed.   Assessment/Plan: Encounter for annual routine gynecological examination  Cervical cancer screening - Plan: IGP, Aptima HPV  Screening for HPV (human papillomavirus) - Plan: IGP, Aptima HPV  Encounter for surveillance of contraceptive pills - OCP RF. - Plan: norethindrone-ethinyl estradiol (BALZIVA) 0.4-35 MG-MCG tablet  Meds ordered this encounter  Medications  . norethindrone-ethinyl estradiol (BALZIVA) 0.4-35 MG-MCG tablet    Sig: Take 1 tablet by mouth daily.    Dispense:  84 tablet    Refill:  3    Order  Specific Question:   Supervising Provider    Answer:   Gae Dry [[id number]]             GYN counsel adequate intake of calcium and vitamin D, diet and exercise     F/U  Return in about 1 year (around 11/13/2018).  Kianah Harries B. Lakayla Barrington, PA-C 11/12/2017 4:45 PM

## 2017-11-16 LAB — IGP, APTIMA HPV
HPV Aptima: NEGATIVE
PAP Smear Comment: 0

## 2017-12-03 ENCOUNTER — Telehealth: Payer: Self-pay | Admitting: Internal Medicine

## 2017-12-03 MED ORDER — LINACLOTIDE 145 MCG PO CAPS: 145.0000 ug | ORAL_CAPSULE | Freq: Every day | ORAL | 0 refills | Status: DC

## 2017-12-03 NOTE — Telephone Encounter (Signed)
Patient has constipation .  And has had success with Linzess in the pastrequesting 145 mcg samples

## 2017-12-13 ENCOUNTER — Other Ambulatory Visit: Payer: Self-pay | Admitting: Obstetrics and Gynecology

## 2017-12-13 DIAGNOSIS — Z3041 Encounter for surveillance of contraceptive pills: Secondary | ICD-10-CM

## 2018-11-19 ENCOUNTER — Ambulatory Visit: Payer: 59 | Admitting: Obstetrics and Gynecology

## 2019-01-20 NOTE — Patient Instructions (Signed)
I value your feedback and entrusting us with your care. If you get a [facility] patient survey, I would appreciate you taking the time to let us know about your experience today. Thank you! 

## 2019-01-20 NOTE — Progress Notes (Signed)
Chief Complaint  Patient presents with  . Gynecologic Exam     HPI:      [name] is a 33 y.o. G0P0000 who LMP was Patient's last menstrual period was 01/08/2019 (exact date)., presents today for her annual examination. Her menses are regular every 28-30 days, lasting 3-4 days. Dysmenorrhea none. She does not have intermenstrual bleeding.  Sex activity: single partner, contraception - none. Stopped OCPs about a yr ago. Doing NFP currently. Will try to conceive this fall. Already taking PNVs. Last Pap: 11/12/17 Results were: no abnormalities /neg HPV DNA  Hx of STDs: none  There is a FH of breast cancer in her MGM, genetic testing not indicated. There is no FH of ovarian cancer. The patient does do self-breast exams.  Tobacco use: The patient denies current or previous tobacco use. Alcohol use: social drinker Exercise: very active  She does get adequate calcium and Vitamin D in her diet.  Normal labs with PCP 2019. Would like fasting labs today.   Past Medical History:  Diagnosis Date  . Dyspareunia in female   . IBS (irritable bowel syndrome)   . Mass of finger of right hand 12/2012   middle finger    Past Surgical History:  Procedure Laterality Date  . FINGER SURGERY     Rheumatoid nodule  . WISDOM TOOTH EXTRACTION      Family History  Problem Relation Age of Onset  . Osteopenia Mother   . COPD Father   . Lung cancer Father   . Breast cancer Maternal Grandmother 93    Social History   Socioeconomic History  . Marital status: Married    Spouse name: Not on file  . Number of children: Not on file  . Years of education: Not on file  . Highest education level: Not on file  Occupational History  . Not on file  Social Needs  . Financial resource strain: Not on file  . Food insecurity    Worry: Not on file    Inability: Not on file  . Transportation needs    Medical: Not on file    Non-medical: Not on file  Tobacco Use  . Smoking  status: Never Smoker  . Smokeless tobacco: Never Used  Substance and Sexual Activity  . Alcohol use: Yes    Comment: ocasionally  . Drug use: No  . Sexual activity: Yes    Birth control/protection: None  Lifestyle  . Physical activity    Days per week: Not on file    Minutes per session: Not on file  . Stress: Not on file  Relationships  . Social Herbalist on phone: Not on file    Gets together: Not on file    Attends religious service: Not on file    Active member of club or organization: Not on file    Attends meetings of clubs or organizations: Not on file    Relationship status: Not on file  . Intimate partner violence    Fear of current or ex partner: Not on file    Emotionally abused: Not on file    Physically abused: Not on file    Forced sexual activity: Not on file  Other Topics Concern  . Not on file  Social History Narrative  . Not on file    No current outpatient medications on file prior to visit.   No current facility-administered medications on file prior to visit.  ROS:  Review of Systems  Constitutional: Negative for fatigue, fever and unexpected weight change.  Respiratory: Negative for cough, shortness of breath and wheezing.   Cardiovascular: Negative for chest pain, palpitations and leg swelling.  Gastrointestinal: Negative for blood in stool, constipation, diarrhea, nausea and vomiting.  Endocrine: Negative for cold intolerance, heat intolerance and polyuria.  Genitourinary: Negative for dyspareunia, dysuria, flank pain, frequency, genital sores, hematuria, menstrual problem, pelvic pain, urgency, vaginal bleeding, vaginal discharge and vaginal pain.  Musculoskeletal: Negative for back pain, joint swelling and myalgias.  Skin: Negative for rash.  Neurological: Negative for dizziness, syncope, light-headedness, numbness and headaches.  Hematological: Negative for adenopathy.  Psychiatric/Behavioral: Negative for agitation,  confusion, sleep disturbance and suicidal ideas. The patient is not nervous/anxious.      Objective: BP 100/70   Ht 5\' 5"  (1.651 m)   Wt 141 lb (64 kg)   LMP 01/08/2019 (Exact Date)   BMI 23.46 kg/m    Physical Exam Constitutional:      Appearance: She is well-developed.  Genitourinary:     Vulva, vagina, uterus, right adnexa and left adnexa normal.     No vulval lesion or tenderness noted.     No vaginal discharge, erythema or tenderness.     No cervical motion tenderness or polyp.     Uterus is not enlarged or tender.     No right or left adnexal mass present.     Right adnexa not tender.     Left adnexa not tender.  Neck:     Musculoskeletal: Normal range of motion.     Thyroid: No thyromegaly.  Cardiovascular:     Rate and Rhythm: Normal rate and regular rhythm.     Heart sounds: Normal heart sounds. No murmur.  Pulmonary:     Effort: Pulmonary effort is normal.     Breath sounds: Normal breath sounds.  Chest:     Breasts:        Right: No mass, nipple discharge, skin change or tenderness.        Left: No mass, nipple discharge, skin change or tenderness.  Abdominal:     Palpations: Abdomen is soft.     Tenderness: There is no abdominal tenderness. There is no guarding.  Musculoskeletal: Normal range of motion.  Neurological:     General: No focal deficit present.     Mental Status: She is alert and oriented to person, place, and time.     Cranial Nerves: No cranial nerve deficit.  Skin:    General: Skin is warm and dry.  Psychiatric:        Mood and Affect: Mood normal.        Behavior: Behavior normal.        Thought Content: Thought content normal.        Judgment: Judgment normal.  Vitals signs reviewed.     Assessment/Plan: Encounter for annual routine gynecological examination -   Pre-conception counseling - Plan: Cont PNVs. F/u after 6 months of trying to conceive if no conception.   Blood tests for routine general physical examination - Plan:  Comprehensive metabolic panel, Lipid panel,   Screening cholesterol level - Plan: Lipid panel,     GYN counsel adequate intake of calcium and vitamin D, diet and exercise     F/U  Return in about 1 year (around 01/21/2020).  Donnica Jarnagin B. Rheana Casebolt, PA-C 01/21/2019 3:19 PM

## 2019-01-21 ENCOUNTER — Ambulatory Visit (INDEPENDENT_AMBULATORY_CARE_PROVIDER_SITE_OTHER): Payer: 59 | Admitting: Obstetrics and Gynecology

## 2019-01-21 ENCOUNTER — Encounter: Payer: Self-pay | Admitting: Obstetrics and Gynecology

## 2019-01-21 ENCOUNTER — Other Ambulatory Visit: Payer: Self-pay

## 2019-01-21 VITALS — BP 100/70 | Ht 65.0 in | Wt 141.0 lb

## 2019-01-21 DIAGNOSIS — Z13220 Encounter for screening for lipoid disorders: Secondary | ICD-10-CM

## 2019-01-21 DIAGNOSIS — Z0000 Encounter for general adult medical examination without abnormal findings: Secondary | ICD-10-CM | POA: Diagnosis not present

## 2019-01-21 DIAGNOSIS — Z01419 Encounter for gynecological examination (general) (routine) without abnormal findings: Secondary | ICD-10-CM

## 2019-01-21 DIAGNOSIS — Z3169 Encounter for other general counseling and advice on procreation: Secondary | ICD-10-CM

## 2019-01-22 LAB — LIPID PANEL
Chol/HDL Ratio: 2.3 ratio (ref 0.0–4.4)
Cholesterol, Total: 206 mg/dL — ABNORMAL HIGH (ref 100–199)
HDL: 88 mg/dL (ref >39)
LDL Calculated: 107 mg/dL — ABNORMAL HIGH (ref 0–99)
Triglycerides: 55 mg/dL (ref 0–149)
VLDL Cholesterol Cal: 11 mg/dL (ref 5–40)

## 2019-01-22 LAB — COMPREHENSIVE METABOLIC PANEL
ALT: 13 IU/L (ref 0–32)
AST: 29 IU/L (ref 0–40)
Albumin/Globulin Ratio: 2.2 (ref 1.2–2.2)
Albumin: 4.8 g/dL (ref 3.8–4.8)
Alkaline Phosphatase: 60 IU/L (ref 39–117)
BUN/Creatinine Ratio: 12 (ref 9–23)
BUN: 10 mg/dL (ref 6–20)
Bilirubin Total: 0.5 mg/dL (ref 0.0–1.2)
CO2: 24 mmol/L (ref 20–29)
Calcium: 9.8 mg/dL (ref 8.7–10.2)
Chloride: 101 mmol/L (ref 96–106)
Creatinine, Ser: 0.84 mg/dL (ref 0.57–1.00)
GFR calc Af Amer: 104 mL/min/{1.73_m2} (ref >59)
GFR calc non Af Amer: 90 mL/min/{1.73_m2} (ref >59)
Globulin, Total: 2.2 g/dL (ref 1.5–4.5)
Glucose: 78 mg/dL (ref 65–99)
Potassium: 4.4 mmol/L (ref 3.5–5.2)
Sodium: 141 mmol/L (ref 134–144)
Total Protein: 7 g/dL (ref 6.0–8.5)

## 2019-03-02 ENCOUNTER — Other Ambulatory Visit: Payer: Self-pay

## 2019-03-02 DIAGNOSIS — Z20822 Contact with and (suspected) exposure to covid-19: Secondary | ICD-10-CM

## 2019-03-02 DIAGNOSIS — R6889 Other general symptoms and signs: Secondary | ICD-10-CM | POA: Diagnosis not present

## 2019-03-03 ENCOUNTER — Telehealth: Payer: Self-pay | Admitting: General Practice

## 2019-03-03 LAB — NOVEL CORONAVIRUS, NAA: SARS-CoV-2, NAA: NOT DETECTED

## 2019-03-03 NOTE — Telephone Encounter (Signed)
Patient informed of negative covid-19 result. Patient verbalized understanding.  °

## 2019-03-19 ENCOUNTER — Other Ambulatory Visit: Payer: Self-pay | Admitting: *Deleted

## 2019-03-19 DIAGNOSIS — Z20822 Contact with and (suspected) exposure to covid-19: Secondary | ICD-10-CM

## 2019-03-19 DIAGNOSIS — R6889 Other general symptoms and signs: Secondary | ICD-10-CM | POA: Diagnosis not present

## 2019-03-21 LAB — NOVEL CORONAVIRUS, NAA: SARS-CoV-2, NAA: NOT DETECTED

## 2019-04-01 ENCOUNTER — Other Ambulatory Visit: Payer: Self-pay | Admitting: Occupational Medicine

## 2019-04-02 LAB — COMPLETE METABOLIC PANEL WITH GFR
AG Ratio: 1.7 (calc) (ref 1.0–2.5)
ALT: 12 U/L (ref 6–29)
AST: 25 U/L (ref 10–30)
Albumin: 4.7 g/dL (ref 3.6–5.1)
Alkaline phosphatase (APISO): 56 U/L (ref 31–125)
BUN: 16 mg/dL (ref 7–25)
CO2: 25 mmol/L (ref 20–32)
Calcium: 9.9 mg/dL (ref 8.6–10.2)
Chloride: 102 mmol/L (ref 98–110)
Creat: 0.92 mg/dL (ref 0.50–1.10)
GFR, Est African American: 93 mL/min/{1.73_m2} (ref >=60)
GFR, Est Non African American: 81 mL/min/{1.73_m2} (ref >=60)
Globulin: 2.7 g/dL (calc) (ref 1.9–3.7)
Glucose, Bld: 100 mg/dL — ABNORMAL HIGH (ref 65–99)
Potassium: 5 mmol/L (ref 3.5–5.3)
Sodium: 137 mmol/L (ref 135–146)
Total Bilirubin: 0.5 mg/dL (ref 0.2–1.2)
Total Protein: 7.4 g/dL (ref 6.1–8.1)

## 2019-04-02 LAB — CBC WITH DIFFERENTIAL/PLATELET
Absolute Monocytes: 511 cells/uL (ref 200–950)
Basophils Absolute: 99 cells/uL (ref 0–200)
Basophils Relative: 1.4 %
Eosinophils Absolute: 50 cells/uL (ref 15–500)
Eosinophils Relative: 0.7 %
HCT: 37.3 % (ref 35.0–45.0)
Hemoglobin: 12.8 g/dL (ref 11.7–15.5)
Lymphs Abs: 2329 cells/uL (ref 850–3900)
MCH: 32.6 pg (ref 27.0–33.0)
MCHC: 34.3 g/dL (ref 32.0–36.0)
MCV: 94.9 fL (ref 80.0–100.0)
MPV: 10.4 fL (ref 7.5–12.5)
Monocytes Relative: 7.2 %
Neutro Abs: 4111 cells/uL (ref 1500–7800)
Neutrophils Relative %: 57.9 %
Platelets: 365 10*3/uL (ref 140–400)
RBC: 3.93 10*6/uL (ref 3.80–5.10)
RDW: 12.6 % (ref 11.0–15.0)
Total Lymphocyte: 32.8 %
WBC: 7.1 10*3/uL (ref 3.8–10.8)

## 2019-04-02 LAB — LIPID PANEL
Cholesterol: 192 mg/dL (ref <200)
HDL: 86 mg/dL (ref >=50)
LDL Cholesterol (Calc): 91 mg/dL (calc)
Non-HDL Cholesterol (Calc): 106 mg/dL (calc) (ref <130)
Total CHOL/HDL Ratio: 2.2 (calc) (ref <5.0)
Triglycerides: 64 mg/dL (ref <150)

## 2019-04-02 LAB — TSH: TSH: 1.48 mIU/L

## 2019-07-08 DIAGNOSIS — H5213 Myopia, bilateral: Secondary | ICD-10-CM | POA: Diagnosis not present

## 2019-09-03 ENCOUNTER — Encounter: Payer: Self-pay | Admitting: Obstetrics and Gynecology

## 2019-09-07 NOTE — Telephone Encounter (Addendum)
Called pt to let her know form she dropped off is ready for her to pick up, along with blood work results and pap. She will stop by this week to pick it up.

## 2019-09-20 ENCOUNTER — Encounter: Payer: Self-pay | Admitting: Obstetrics and Gynecology

## 2019-11-15 ENCOUNTER — Telehealth: Payer: Self-pay | Admitting: Obstetrics and Gynecology

## 2019-11-15 NOTE — Telephone Encounter (Signed)
Pt called to adv of LMP day 1 - 11/12/19  Trying to schedule HSG.  5/11 and 5/13 were not available at [facility]. Adv pt to call back next cycle and we can try to schedule then.

## 2019-12-10 ENCOUNTER — Telehealth: Payer: Self-pay | Admitting: Obstetrics and Gynecology

## 2019-12-10 ENCOUNTER — Other Ambulatory Visit: Payer: Self-pay | Admitting: Obstetrics and Gynecology

## 2019-12-10 DIAGNOSIS — N979 Female infertility, unspecified: Secondary | ICD-10-CM

## 2019-12-10 NOTE — Telephone Encounter (Signed)
Called pt to adv that she can tentatievly plan to have HSG done on 6/10. I could not schedule today without a placed order. I adv that this is assuming that someone else does not schedule for that date before we can. She was very understanding and said waiting another month would not be a problem if it didn't work out this month.

## 2019-12-10 NOTE — Telephone Encounter (Signed)
Pt called to adv that today is day 1 of cycle and would like to schedule HSG.   6/8 is not available.  Maria Cohen is in the office today and I asked if she could administer the test on 6/10 as she is already in scheduled in OR. She agreed to do so.

## 2019-12-10 NOTE — Telephone Encounter (Signed)
I placed HSG order

## 2019-12-13 NOTE — Telephone Encounter (Signed)
Patient is aware of location date and time of HSG

## 2019-12-16 ENCOUNTER — Ambulatory Visit
Admission: RE | Admit: 2019-12-16 | Discharge: 2019-12-16 | Disposition: A | Discharge status: Home / Self Care | Payer: 59 | Source: Ambulatory Visit | Attending: Obstetrics and Gynecology | Admitting: Obstetrics and Gynecology

## 2019-12-16 ENCOUNTER — Other Ambulatory Visit: Payer: Self-pay

## 2019-12-16 DIAGNOSIS — N979 Female infertility, unspecified: Secondary | ICD-10-CM | POA: Diagnosis not present

## 2019-12-16 MED ORDER — IOHEXOL 300 MG/ML  SOLN
10.0000 mL | Freq: Once | INTRAMUSCULAR | Status: AC | PRN
  Administered 2019-12-16: 10 mL

## 2019-12-16 NOTE — Procedures (Signed)
Prep and drape done.  Cervix normal.  Tenaculum placed.  Small cervical canal, cervix dilated minimally.  Catheter placed into uterine cavity. Approximately 20 mL dye injected under fluoroscopic visualization.  Patent bilateral fallopian tube seen.  Catheter and tenaculum removed.  Pt stable, tolerated procedure well.  Adrian Prows MD, [facility], [facility] 12/16/2019 2:20 PM

## 2020-01-26 ENCOUNTER — Encounter: Payer: Self-pay | Admitting: Obstetrics and Gynecology

## 2020-01-26 ENCOUNTER — Ambulatory Visit (INDEPENDENT_AMBULATORY_CARE_PROVIDER_SITE_OTHER): Payer: 59 | Admitting: Obstetrics and Gynecology

## 2020-01-26 ENCOUNTER — Other Ambulatory Visit: Payer: Self-pay

## 2020-01-26 VITALS — BP 106/70 | Ht 65.0 in | Wt 137.0 lb

## 2020-01-26 DIAGNOSIS — Z01419 Encounter for gynecological examination (general) (routine) without abnormal findings: Secondary | ICD-10-CM | POA: Diagnosis not present

## 2020-01-26 DIAGNOSIS — N979 Female infertility, unspecified: Secondary | ICD-10-CM | POA: Diagnosis not present

## 2020-01-26 NOTE — Progress Notes (Signed)
Chief Complaint  Patient presents with   Gynecologic Exam     HPI:      [name] is a 34 y.o. G0P0000 who LMP was Patient's last menstrual period was 01/07/2020 (exact date)., presents today for her annual examination. Her menses are regular every 28-30 days, lasting 3-4 days. Dysmenorrhea none. She does not have intermenstrual bleeding.  Sex activity: single partner, contraception - none. Trying to conceive since 03/2019. Taking PNVs. Had normal ovulation with urine pred kit, semen analysis 3/21, and HSG 6/21 with Dr. Gilman Cohen. Wants to give it a few more months to conceive. Will then consider REI ref Last Pap: 11/12/17 Results were: no abnormalities /neg HPV DNA  Hx of STDs: none  There is a FH of breast cancer in her MGM, genetic testing not indicated. There is no FH of ovarian cancer. The patient does do self-breast exams.  Tobacco use: The patient denies current or previous tobacco use. Alcohol use: social drinker No drug use. Exercise: very active  She does get adequate calcium and Vitamin D in her diet.  Normal fasting labs with 9/20  Past Medical History:  Diagnosis Date   Dyspareunia in female    IBS (irritable bowel syndrome)    Mass of finger of right hand 12/2012   middle finger    Past Surgical History:  Procedure Laterality Date   FINGER SURGERY     Rheumatoid nodule   WISDOM TOOTH EXTRACTION      Family History  Problem Relation Age of Onset   Osteopenia Mother    COPD Father    Lung cancer Father    Breast cancer Maternal Grandmother 61    Social History   Socioeconomic History   Marital status: Married    Spouse name: Not on file   Number of children: Not on file   Years of education: Not on file   Highest education level: Not on file  Occupational History   Not on file  Tobacco Use   Smoking status: Never Smoker   Smokeless tobacco: Never Used  Vaping Use   Vaping Use: Never used  Substance and Sexual  Activity   Alcohol use: Yes    Comment: ocasionally   Drug use: No   Sexual activity: Yes    Birth control/protection: None  Other Topics Concern   Not on file  Social History Narrative   Not on file   Social Determinants of Health   Financial Resource Strain:    Difficulty of Paying Living Expenses:   Food Insecurity:    Worried About Charity fundraiser in the Last Year:    Arboriculturist in the Last Year:   Transportation Needs:    Film/video editor (Medical):    Lack of Transportation (Non-Medical):   Physical Activity:    Days of Exercise per Week:    Minutes of Exercise per Session:   Stress:    Feeling of Stress :   Social Connections:    Frequency of Communication with Friends and Family:    Frequency of Social Gatherings with Friends and Family:    Attends Religious Services:    Active Member of Clubs or Organizations:    Attends Archivist Meetings:    Marital Status:   Intimate Partner Violence:    Fear of Current or Ex-Partner:    Emotionally Abused:    Physically Abused:    Sexually Abused:     No current outpatient medications  on file prior to visit.   No current facility-administered medications on file prior to visit.      ROS:  Review of Systems  Constitutional: Negative for fatigue, fever and unexpected weight change.  Respiratory: Negative for cough, shortness of breath and wheezing.   Cardiovascular: Negative for chest pain, palpitations and leg swelling.  Gastrointestinal: Negative for blood in stool, constipation, diarrhea, nausea and vomiting.  Endocrine: Negative for cold intolerance, heat intolerance and polyuria.  Genitourinary: Negative for dyspareunia, dysuria, flank pain, frequency, genital sores, hematuria, menstrual problem, pelvic pain, urgency, vaginal bleeding, vaginal discharge and vaginal pain.  Musculoskeletal: Negative for back pain, joint swelling and myalgias.  Skin: Negative for  rash.  Neurological: Negative for dizziness, syncope, light-headedness, numbness and headaches.  Hematological: Negative for adenopathy.  Psychiatric/Behavioral: Negative for agitation, confusion, sleep disturbance and suicidal ideas. The patient is not nervous/anxious.      Objective: BP 106/70    Ht '5\' 5"'$  (1.651 m)    Wt 137 lb (62.1 kg)    LMP 01/07/2020 (Exact Date)    BMI 22.80 kg/m    Physical Exam Constitutional:      Appearance: She is well-developed.  Genitourinary:     Vulva, vagina, uterus, right adnexa and left adnexa normal.     No vulval lesion or tenderness noted.     No vaginal discharge, erythema or tenderness.     No cervical motion tenderness or polyp.     Uterus is not enlarged or tender.     No right or left adnexal mass present.     Right adnexa not tender.     Left adnexa not tender.  Neck:     Thyroid: No thyromegaly.  Cardiovascular:     Rate and Rhythm: Normal rate and regular rhythm.     Heart sounds: Normal heart sounds. No murmur heard.   Pulmonary:     Effort: Pulmonary effort is normal.     Breath sounds: Normal breath sounds.  Chest:     Breasts:        Right: No mass, nipple discharge, skin change or tenderness.        Left: No mass, nipple discharge, skin change or tenderness.  Abdominal:     Palpations: Abdomen is soft.     Tenderness: There is no abdominal tenderness. There is no guarding.  Musculoskeletal:        General: Normal range of motion.     Cervical back: Normal range of motion.  Neurological:     General: No focal deficit present.     Mental Status: She is alert and oriented to person, place, and time.     Cranial Nerves: No cranial nerve deficit.  Skin:    General: Skin is warm and dry.  Psychiatric:        Mood and Affect: Mood normal.        Behavior: Behavior normal.        Thought Content: Thought content normal.        Judgment: Judgment normal.  Vitals reviewed.     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 Next step is REI ref. Pt to give it a few more months and f/u prn. Cont PNVs.   GYN counsel adequate intake of calcium and vitamin D, diet and exercise     F/U  Return in about 1 year (around 01/25/2021).  Dashiell Franchino B. Jamaris Biernat, PA-C 01/26/2020 4:25 PM

## 2020-01-26 NOTE — Patient Instructions (Signed)
I value your feedback and entrusting us with your care. If you get a [facility] patient survey, I would appreciate you taking the time to let us know about your experience today. Thank you!  As of June 17, 2019, your lab results will be released to your MyChart immediately, before I even have a chance to see them. Please give me time to review them and contact you if there are any abnormalities. Thank you for your patience.  

## 2020-02-06 DIAGNOSIS — Z20822 Contact with and (suspected) exposure to covid-19: Secondary | ICD-10-CM | POA: Diagnosis not present

## 2020-05-12 ENCOUNTER — Ambulatory Visit: Payer: 59

## 2020-07-13 DIAGNOSIS — H5213 Myopia, bilateral: Secondary | ICD-10-CM | POA: Diagnosis not present

## 2020-10-01 DIAGNOSIS — Z20822 Contact with and (suspected) exposure to covid-19: Secondary | ICD-10-CM | POA: Diagnosis not present

## 2020-11-13 ENCOUNTER — Other Ambulatory Visit: Payer: Self-pay

## 2020-11-13 ENCOUNTER — Telehealth (INDEPENDENT_AMBULATORY_CARE_PROVIDER_SITE_OTHER): Payer: 59 | Admitting: Internal Medicine

## 2020-11-13 ENCOUNTER — Encounter: Payer: Self-pay | Admitting: Internal Medicine

## 2020-11-13 DIAGNOSIS — K5909 Other constipation: Secondary | ICD-10-CM

## 2020-11-13 DIAGNOSIS — I7300 Raynaud's syndrome without gangrene: Secondary | ICD-10-CM | POA: Diagnosis not present

## 2020-11-13 DIAGNOSIS — R41840 Attention and concentration deficit: Secondary | ICD-10-CM | POA: Diagnosis not present

## 2020-11-13 MED ORDER — AMPHETAMINE-DEXTROAMPHETAMINE 10 MG PO TABS
10.0000 mg | ORAL_TABLET | Freq: Two times a day (BID) | ORAL | 0 refills | Status: DC
  Filled 2020-11-13: qty 60, 30d supply, fill #0

## 2020-11-13 NOTE — Progress Notes (Signed)
Virtual Visit via Video Note  I connected with Maria Cohen on 11/13/20 at  3:20 PM EDT by a video enabled telemedicine application and verified that I am speaking with the correct person using two identifiers.  Location: Patient: Work Provider: Buyer, retail in this video call: Webb Silversmith, NP and Allie Bossier   I discussed the limitations of evaluation and management by telemedicine and the availability of in person appointments. The patient expressed understanding and agreed to proceed.  HPI  Pt presents to the clinic today to establish care and for management of the conditions listed below. She is transferring care from [place], [place].  Chronic Constipation: Managed with diet and Magnesium OTC. She has a BM about 1 x week. She intermittently has some abdominal bloating but denies nausea, vomiting, diarrhea or blood in her stool. She is not following with GI.  Raynaud's: This typically affects her during the colder weather. She is not taking a calcium channel blocker.  She also reports difficulty concentrating and focusing. She is currently working full time and in school to obtain her doctorate. She has never been diagnosed with ADD but feels like she would benefit from medication to help stay on task, so that she can complete required tasks at school and at work.  Past Medical History:  Diagnosis Date  . Chronic constipation   . Raynaud's disease     Current Outpatient Medications  Medication Sig Dispense Refill  . amphetamine-dextroamphetamine (ADDERALL) 10 MG tablet Take 1 tablet (10 mg total) by mouth 2 (two) times daily with a meal. 60 tablet 0   No current facility-administered medications for this visit.    No Known Allergies  Family History  Problem Relation Age of Onset  . Osteopenia Mother   . COPD Father   . Lung cancer Father   . Breast cancer Maternal Grandmother 60  . Kidney disease Maternal Grandmother     Social History    Socioeconomic History  . Marital status: Married    Spouse name: Not on file  . Number of children: Not on file  . Years of education: Not on file  . Highest education level: Not on file  Occupational History  . Not on file  Tobacco Use  . Smoking status: Never Smoker  . Smokeless tobacco: Never Used  Vaping Use  . Vaping Use: Never used  Substance and Sexual Activity  . Alcohol use: Yes    Comment: ocasionally  . Drug use: No  . Sexual activity: Yes    Birth control/protection: None  Other Topics Concern  . Not on file  Social History Narrative  . Not on file   Social Determinants of Health   Financial Resource Strain: Not on file  Food Insecurity: Not on file  Transportation Needs: Not on file  Physical Activity: Not on file  Stress: Not on file  Social Connections: Not on file  Intimate Partner Violence: Not on file    ROS:  Constitutional: Denies fever, malaise, fatigue, headache or abrupt weight changes.  HEENT: Denies eye pain, eye redness, ear pain, ringing in the ears, wax buildup, runny nose, nasal congestion, bloody nose, or sore throat. Respiratory: Denies difficulty breathing, shortness of breath, cough or sputum production.   Cardiovascular: Denies chest pain, chest tightness, palpitations or swelling in the hands or feet.  Gastrointestinal: Pt reports abdominal bloating, chronic constipation. Denies abdominal pain, bloating, diarrhea or blood in the stool.  GU: Denies frequency, urgency, pain with urination, blood  in urine, odor or discharge. Musculoskeletal: Denies decrease in range of motion, difficulty with gait, muscle pain or joint pain and swelling.  Skin: Pt reports intermittent discoloration of fingertips. Denies redness, rashes, lesions or ulcercations.  Neurological: Pt reports difficulty concentrating. Denies dizziness, difficulty with memory, difficulty with speech or problems with balance and coordination.  Psych: Denies anxiety,  depression, SI/HI.  No other specific complaints in a complete review of systems (except as listed in HPI above).  PE:  BP 104/60   Pulse 62   Resp 18   Ht 5\' 5"  (1.651 m)   Wt 140 lb (63.5 kg)   SpO2 99%   BMI 23.30 kg/m  Wt Readings from Last 3 Encounters:  11/14/20 140 lb (63.5 kg)  01/26/20 137 lb (62.1 kg)  01/21/19 141 lb (64 kg)    General: Appears her stated age, well developed, well nourished in NAD. HEENT: Head: normal shape and size; Eyes: sclera whiteand EOMs intact;  Cardiovascular: Normal rate. Pulmonary/Chest: Normal effort. Musculoskeletal: No difficulty with gait.  Neurological: Alert and oriented.  Psychiatric: Mood and affect normal. Behavior is normal. Judgment and thought content normal.     BMET    Component Value Date/Time   NA 137 04/01/2019 1050   NA 141 01/21/2019 1527   K 5.0 04/01/2019 1050   CL 102 04/01/2019 1050   CO2 25 04/01/2019 1050   GLUCOSE 100 (H) 04/01/2019 1050   BUN 16 04/01/2019 1050   BUN 10 01/21/2019 1527   CREATININE 0.92 04/01/2019 1050   CALCIUM 9.9 04/01/2019 1050   GFRNONAA 81 04/01/2019 1050   GFRAA 93 04/01/2019 1050    Lipid Panel     Component Value Date/Time   CHOL 192 04/01/2019 1050   CHOL 206 (H) 01/21/2019 1527   TRIG 64 04/01/2019 1050   HDL 86 04/01/2019 1050   HDL 88 01/21/2019 1527   CHOLHDL 2.2 04/01/2019 1050   LDLCALC 91 04/01/2019 1050    CBC    Component Value Date/Time   WBC 7.1 04/01/2019 1050   RBC 3.93 04/01/2019 1050   HGB 12.8 04/01/2019 1050   HCT 37.3 04/01/2019 1050   PLT 365 04/01/2019 1050   MCV 94.9 04/01/2019 1050   MCH 32.6 04/01/2019 1050   MCHC 34.3 04/01/2019 1050   RDW 12.6 04/01/2019 1050   LYMPHSABS 2,329 04/01/2019 1050   EOSABS 50 04/01/2019 1050   BASOSABS 99 04/01/2019 1050    Hgb A1C No results found for: HGBA1C   Assessment and Plan:   Follow Up Instructions:    I discussed the assessment and treatment plan with the patient. The patient  was provided an opportunity to ask questions and all were answered. The patient agreed with the plan and demonstrated an understanding of the instructions.   The patient was advised to call back or seek an in-person evaluation if the symptoms worsen or if the condition fails to improve as anticipated.   Webb Silversmith, NP

## 2020-11-14 ENCOUNTER — Encounter: Payer: Self-pay | Admitting: Internal Medicine

## 2020-11-14 DIAGNOSIS — K5909 Other constipation: Secondary | ICD-10-CM | POA: Insufficient documentation

## 2020-11-14 DIAGNOSIS — I7300 Raynaud's syndrome without gangrene: Secondary | ICD-10-CM | POA: Insufficient documentation

## 2020-11-14 DIAGNOSIS — R41840 Attention and concentration deficit: Secondary | ICD-10-CM | POA: Insufficient documentation

## 2020-11-14 NOTE — Assessment & Plan Note (Signed)
Not medicated Will monitor symptoms for now

## 2020-11-14 NOTE — Assessment & Plan Note (Signed)
Continue high fiber diet Continue Magnesium OTC

## 2020-11-14 NOTE — Patient Instructions (Signed)
Constipation, Adult Constipation is when a person has trouble pooping (having a bowel movement). When you have this condition, you may poop fewer than 3 times a week. Your poop (stool) may also be dry, hard, or bigger than normal. Follow these instructions at home: Eating and drinking  Eat foods that have a lot of fiber, such as: ? Fresh fruits and vegetables. ? Whole grains. ? Beans.  Eat less of foods that are low in fiber and high in fat and sugar, such as: ? French fries. ? Hamburgers. ? Cookies. ? Candy. ? Soda.  Drink enough fluid to keep your pee (urine) pale yellow.   General instructions  Exercise regularly or as told by your doctor. Try to do 150 minutes of exercise each week.  Go to the restroom when you feel like you need to poop. Do not hold it in.  Take over-the-counter and prescription medicines only as told by your doctor. These include any fiber supplements.  When you poop: ? Do deep breathing while relaxing your lower belly (abdomen). ? Relax your pelvic floor. The pelvic floor is a group of muscles that support the rectum, bladder, and intestines (as well as the uterus in women).  Watch your condition for any changes. Tell your doctor if you notice any.  Keep all follow-up visits as told by your doctor. This is important. Contact a doctor if:  You have pain that gets worse.  You have a fever.  You have not pooped for 4 days.  You vomit.  You are not hungry.  You lose weight.  You are bleeding from the opening of the butt (anus).  You have thin, pencil-like poop. Get help right away if:  You have a fever, and your symptoms suddenly get worse.  You leak poop or have blood in your poop.  Your belly feels hard or bigger than normal (bloated).  You have very bad belly pain.  You feel dizzy or you faint. Summary  Constipation is when a person poops fewer than 3 times a week, has trouble pooping, or has poop that is dry, hard, or bigger than  normal.  Eat foods that have a lot of fiber.  Drink enough fluid to keep your pee (urine) pale yellow.  Take over-the-counter and prescription medicines only as told by your doctor. These include any fiber supplements. This information is not intended to replace advice given to you by your health care provider. Make sure you discuss any questions you have with your health care provider. Document Revised: 05/12/2019 Document Reviewed: 05/12/2019 Elsevier Patient Education  2021 [employer]vier Inc.  

## 2020-11-14 NOTE — Assessment & Plan Note (Signed)
Will trial Adderall 10 mg BID She will update me in 1 month and let me know how this is working for her

## 2020-11-20 ENCOUNTER — Encounter: Payer: Self-pay | Admitting: Internal Medicine

## 2020-11-20 NOTE — Telephone Encounter (Signed)
Can you enter in this information?  KP

## 2020-12-03 DIAGNOSIS — Z20822 Contact with and (suspected) exposure to covid-19: Secondary | ICD-10-CM | POA: Diagnosis not present

## 2021-01-01 ENCOUNTER — Other Ambulatory Visit: Payer: Self-pay | Admitting: Internal Medicine

## 2021-01-01 MED ORDER — AMPHETAMINE-DEXTROAMPHETAMINE 10 MG PO TABS: 10.0000 mg | ORAL_TABLET | Freq: Two times a day (BID) | ORAL | 0 refills | Status: DC

## 2021-01-04 ENCOUNTER — Other Ambulatory Visit: Payer: Self-pay | Admitting: Internal Medicine

## 2021-01-04 ENCOUNTER — Other Ambulatory Visit: Payer: Self-pay

## 2021-01-04 MED ORDER — AMPHETAMINE-DEXTROAMPHETAMINE 10 MG PO TABS
10.0000 mg | ORAL_TABLET | Freq: Two times a day (BID) | ORAL | 0 refills | Status: DC
  Filled 2021-01-04: qty 60, 30d supply, fill #0

## 2021-01-04 NOTE — Telephone Encounter (Signed)
   Notes to clinic: NT cannot refuse this refill  Looks like this was refilled on 01/01/2021   Requested Prescriptions  Pending Prescriptions Disp Refills   amphetamine-dextroamphetamine (ADDERALL) 10 MG tablet 60 tablet 0    Sig: Take 1 tablet (10 mg total) by mouth 2 (two) times daily with a meal.      Not Delegated - Psychiatry:  Stimulants/ADHD Failed - 01/04/2021  1:22 PM      Failed - This refill cannot be delegated      Failed - Urine Drug Screen completed in last 360 days      Passed - Valid encounter within last 3 months    Recent Outpatient Visits           1 month ago Chronic constipation   [facility] In [place], Coralie Keens, NP       Future Appointments             In 4 months Laurence Ferrari, [place], [place]

## 2021-01-29 ENCOUNTER — Ambulatory Visit (INDEPENDENT_AMBULATORY_CARE_PROVIDER_SITE_OTHER): Payer: 59 | Admitting: Obstetrics and Gynecology

## 2021-01-29 ENCOUNTER — Other Ambulatory Visit: Payer: Self-pay

## 2021-01-29 ENCOUNTER — Other Ambulatory Visit (HOSPITAL_COMMUNITY)
Admission: RE | Admit: 2021-01-29 | Discharge: 2021-01-29 | Disposition: A | Discharge status: Home / Self Care | Payer: 59 | Source: Ambulatory Visit | Attending: Obstetrics and Gynecology | Admitting: Obstetrics and Gynecology

## 2021-01-29 ENCOUNTER — Encounter: Payer: Self-pay | Admitting: Obstetrics and Gynecology

## 2021-01-29 VITALS — BP 110/70 | Ht 65.0 in | Wt 143.0 lb

## 2021-01-29 DIAGNOSIS — Z1151 Encounter for screening for human papillomavirus (HPV): Secondary | ICD-10-CM | POA: Insufficient documentation

## 2021-01-29 DIAGNOSIS — Z01419 Encounter for gynecological examination (general) (routine) without abnormal findings: Secondary | ICD-10-CM | POA: Diagnosis not present

## 2021-01-29 DIAGNOSIS — Z124 Encounter for screening for malignant neoplasm of cervix: Secondary | ICD-10-CM | POA: Insufficient documentation

## 2021-01-29 DIAGNOSIS — N979 Female infertility, unspecified: Secondary | ICD-10-CM

## 2021-01-29 NOTE — Patient Instructions (Signed)
I value your feedback and you entrusting us with your care. If you get a [facility] patient survey, I would appreciate you taking the time to let us know about your experience today. Thank you! ? ? ?

## 2021-01-29 NOTE — Progress Notes (Signed)
Chief Complaint  Patient presents with   Gynecologic Exam    No concerns    HPI:      [name] is a 35 y.o. G0P0000 who LMP was Patient's last menstrual period was 01/05/2021 (exact date)., presents today for her annual examination. Her menses are regular every 28-30 days, lasting 3-4 days. Dysmenorrhea none. She does not have intermenstrual bleeding.   Sex activity: single partner, contraception - none. Trying to conceive since 03/2019. Taking PNVs. Had normal ovulation with urine pred kit, normal semen analysis 3/21, and normal HSG 6/21 with Dr. Jerene Pitch. Put off conception recently due to starting doctorate program. Will start trying again this fall. Knows next step is REI referral.  Last Pap: 11/12/17 Results were: no abnormalities /neg HPV DNA  Hx of STDs: none   There is a FH of breast cancer in her MGM, genetic testing not indicated. There is no FH of ovarian cancer. The patient does self-breast exams.   Tobacco use: The patient denies current or previous tobacco use. Alcohol use: social drinker No drug use. Exercise: very active   She does get adequate calcium and Vitamin D in her diet.   Normal fasting labs 9/20; normal labs through work screening  Past Medical History:  Diagnosis Date   Chronic constipation    Raynaud's disease     Past Surgical History:  Procedure Laterality Date   FINGER SURGERY     Rheumatoid nodule   WISDOM TOOTH EXTRACTION      Family History  Problem Relation Age of Onset   Osteopenia Mother    COPD Father    Lung cancer Father    Breast cancer Maternal Grandmother 42   Kidney disease Maternal Grandmother     Social History   Socioeconomic History   Marital status: Married    Spouse name: Not on file   Number of children: Not on file   Years of education: Not on file   Highest education level: Not on file  Occupational History   Not on file  Tobacco Use   Smoking status: Never   Smokeless tobacco: Never  Vaping  Use   Vaping Use: Never used  Substance and Sexual Activity   Alcohol use: Yes    Comment: ocasionally   Drug use: No   Sexual activity: Yes    Birth control/protection: None  Other Topics Concern   Not on file  Social History Narrative   Not on file   Social Determinants of Health   Financial Resource Strain: Not on file  Food Insecurity: Not on file  Transportation Needs: Not on file  Physical Activity: Not on file  Stress: Not on file  Social Connections: Not on file  Intimate Partner Violence: Not on file    Current Outpatient Medications on File Prior to Visit  Medication Sig Dispense Refill   amphetamine-dextroamphetamine (ADDERALL) 10 MG tablet Take 1 tablet (10 mg total) by mouth 2 (two) times daily with a meal. 60 tablet 0   No current facility-administered medications on file prior to visit.      ROS:  Review of Systems  Constitutional:  Negative for fatigue, fever and unexpected weight change.  Respiratory:  Negative for cough, shortness of breath and wheezing.   Cardiovascular:  Negative for chest pain, palpitations and leg swelling.  Gastrointestinal:  Negative for blood in stool, constipation, diarrhea, nausea and vomiting.  Endocrine: Negative for cold intolerance, heat intolerance and polyuria.  Genitourinary:  Negative for  dyspareunia, dysuria, flank pain, frequency, genital sores, hematuria, menstrual problem, pelvic pain, urgency, vaginal bleeding, vaginal discharge and vaginal pain.  Musculoskeletal:  Negative for back pain, joint swelling and myalgias.  Skin:  Negative for rash.  Neurological:  Negative for dizziness, syncope, light-headedness, numbness and headaches.  Hematological:  Negative for adenopathy.  Psychiatric/Behavioral:  Negative for agitation, confusion, sleep disturbance and suicidal ideas. The patient is not nervous/anxious.     Objective: BP 110/70   Ht $R'5\' 5"'Bf$  (1.651 m)   Wt 143 lb (64.9 kg)   LMP 01/05/2021 (Exact Date)    BMI 23.80 kg/m    Physical Exam Constitutional:      Appearance: She is well-developed.  Genitourinary:     Vulva normal.     Right Labia: No rash, tenderness or lesions.    Left Labia: No tenderness, lesions or rash.    No vaginal discharge, erythema or tenderness.      Right Adnexa: not tender and no mass present.    Left Adnexa: not tender and no mass present.    No cervical motion tenderness, friability or polyp.     Uterus is not enlarged or tender.  Breasts:    Right: No mass, nipple discharge, skin change or tenderness.     Left: No mass, nipple discharge, skin change or tenderness.  Neck:     Thyroid: No thyromegaly.  Cardiovascular:     Rate and Rhythm: Normal rate and regular rhythm.     Heart sounds: Normal heart sounds. No murmur heard. Pulmonary:     Effort: Pulmonary effort is normal.     Breath sounds: Normal breath sounds.  Abdominal:     Palpations: Abdomen is soft.     Tenderness: There is no abdominal tenderness. There is no guarding or rebound.  Musculoskeletal:        General: Normal range of motion.     Cervical back: Normal range of motion.  Lymphadenopathy:     Cervical: No cervical adenopathy.  Neurological:     General: No focal deficit present.     Mental Status: She is alert and oriented to person, place, and time.     Cranial Nerves: No cranial nerve deficit.  Skin:    General: Skin is warm and dry.  Psychiatric:        Mood and Affect: Mood normal.        Behavior: Behavior normal.        Thought Content: Thought content normal.        Judgment: Judgment normal.  Vitals reviewed.    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 Next step is REI ref. Pt will call for ref prn. Cont PNVs.    GYN counsel adequate intake of calcium and vitamin D, diet and  exercise     F/U  Return in about 1 year (around 01/29/2022).  Lily Kernen B. Kya Mayfield, PA-C 01/29/2021 4:06 PM

## 2021-02-01 LAB — CYTOLOGY - PAP
Comment: NEGATIVE
Diagnosis: NEGATIVE
High risk HPV: NEGATIVE

## 2021-02-27 ENCOUNTER — Other Ambulatory Visit: Payer: Self-pay

## 2021-02-27 ENCOUNTER — Other Ambulatory Visit: Payer: Self-pay | Admitting: Internal Medicine

## 2021-02-27 MED ORDER — AMPHETAMINE-DEXTROAMPHETAMINE 15 MG PO TABS
15.0000 mg | ORAL_TABLET | Freq: Two times a day (BID) | ORAL | 0 refills | Status: DC
Start: 2021-02-27 — End: 2021-04-23
  Filled 2021-02-27: qty 60, 30d supply, fill #0

## 2021-04-23 ENCOUNTER — Other Ambulatory Visit: Payer: Self-pay | Admitting: Internal Medicine

## 2021-04-23 ENCOUNTER — Other Ambulatory Visit: Payer: Self-pay

## 2021-04-23 MED ORDER — AMPHETAMINE-DEXTROAMPHETAMINE 15 MG PO TABS
15.0000 mg | ORAL_TABLET | Freq: Two times a day (BID) | ORAL | 0 refills | Status: DC
  Filled 2021-04-23: qty 60, 30d supply, fill #0

## 2021-05-23 ENCOUNTER — Ambulatory Visit (INDEPENDENT_AMBULATORY_CARE_PROVIDER_SITE_OTHER): Payer: 59 | Admitting: Dermatology

## 2021-05-23 ENCOUNTER — Other Ambulatory Visit: Payer: Self-pay

## 2021-05-23 DIAGNOSIS — D1801 Hemangioma of skin and subcutaneous tissue: Secondary | ICD-10-CM

## 2021-05-23 DIAGNOSIS — Z1283 Encounter for screening for malignant neoplasm of skin: Secondary | ICD-10-CM

## 2021-05-23 DIAGNOSIS — Z840 Family history of diseases of the skin and subcutaneous tissue: Secondary | ICD-10-CM | POA: Diagnosis not present

## 2021-05-23 DIAGNOSIS — L578 Other skin changes due to chronic exposure to nonionizing radiation: Secondary | ICD-10-CM | POA: Diagnosis not present

## 2021-05-23 DIAGNOSIS — D229 Melanocytic nevi, unspecified: Secondary | ICD-10-CM

## 2021-05-23 DIAGNOSIS — L821 Other seborrheic keratosis: Secondary | ICD-10-CM

## 2021-05-23 DIAGNOSIS — D2272 Melanocytic nevi of left lower limb, including hip: Secondary | ICD-10-CM | POA: Diagnosis not present

## 2021-05-23 DIAGNOSIS — B078 Other viral warts: Secondary | ICD-10-CM | POA: Diagnosis not present

## 2021-05-23 DIAGNOSIS — D225 Melanocytic nevi of trunk: Secondary | ICD-10-CM

## 2021-05-23 DIAGNOSIS — L814 Other melanin hyperpigmentation: Secondary | ICD-10-CM | POA: Diagnosis not present

## 2021-05-23 NOTE — Patient Instructions (Addendum)
Cryotherapy Aftercare  Wash gently with soap and water everyday.   Apply Vaseline and Band-Aid daily until healed.   Prior to procedure, discussed risks of blister formation, small wound, skin dyspigmentation, or rare scar following cryotherapy. Recommend Vaseline ointment to treated areas while healing.  Recommend taking Heliocare sun protection supplement daily in sunny weather for additional sun protection. For maximum protection on the sunniest days, you can take up to 2 capsules of regular Heliocare OR take 1 capsule of Heliocare Ultra. For prolonged exposure (such as a full day in the sun), you can repeat your dose of the supplement 4 hours after your first dose. Heliocare can be purchased at [facility] or at [email or web address].     Melanoma ABCDEs  Melanoma is the most dangerous type of skin cancer, and is the leading cause of death from skin disease.  You are more likely to develop melanoma if you: Have light-colored skin, light-colored eyes, or red or blond hair Spend a lot of time in the sun Tan regularly, either outdoors or in a tanning bed Have had blistering sunburns, especially during childhood Have a close family member who has had a melanoma Have atypical moles or large birthmarks  Early detection of melanoma is key since treatment is typically straightforward and cure rates are extremely high if we catch it early.   The first sign of melanoma is often a change in a mole or a new dark spot.  The ABCDE system is a way of remembering the signs of melanoma.  A for asymmetry:  The two halves do not match. B for border:  The edges of the growth are irregular. C for color:  A mixture of colors are present instead of an even brown color. D for diameter:  Melanomas are usually (but not always) greater than 77mm - the size of a pencil eraser. E for evolution:  The spot keeps changing in size, shape, and color.  Please check your skin once per month between visits. You can  use a small mirror in front and a large mirror behind you to keep an eye on the back side or your body.   If you see any new or changing lesions before your next follow-up, please call to schedule a visit.  Please continue daily skin protection including broad spectrum sunscreen SPF 30+ to sun-exposed areas, reapplying every 2 hours as needed when you're outdoors.    If You Need Anything After Your Visit  If you have any questions or concerns for your doctor, please call our main line at [phone] and press option 4 to reach your doctor's medical assistant. If no one answers, please leave a voicemail as directed and we will return your call as soon as possible. Messages left after 4 pm will be answered the following business day.   You may also send [place] a message via [employer]. We typically respond to MyChart messages within 1-2 business days.  For prescription refills, please ask your pharmacy to contact our office. Our fax number is [phone].  If you have an urgent issue when the clinic is closed that cannot wait until the next business day, you can page your doctor at the number below.    Please note that while we do our best to be available for urgent issues outside of office hours, we are not available 24/7.   If you have an urgent issue and are unable to reach [place], you may choose to seek medical care at your doctor's  office, retail clinic, urgent care center, or emergency room.  If you have a medical emergency, please immediately call 911 or go to the emergency department.  Pager Numbers  - Dr. Nehemiah Massed: [phone]  - Dr. Laurence Ferrari: [phone]  - Dr. Nicole Kindred: [phone]  In the event of inclement weather, please call our main line at [phone] for an update on the status of any delays or closures.  Dermatology Medication Tips: Please keep the boxes that topical medications come in in order to help keep track of the instructions about where and how to use these.  Pharmacies typically print the medication instructions only on the boxes and not directly on the medication tubes.   If your medication is too expensive, please contact our office at [phone] option 4 or send [place] a message through [place].   We are unable to tell what your co-pay for medications will be in advance as this is different depending on your insurance coverage. However, we may be able to find a substitute medication at lower cost or fill out paperwork to get insurance to cover a needed medication.   If a prior authorization is required to get your medication covered by your insurance company, please allow [place] 1-2 business days to complete this process.  Drug prices often vary depending on where the prescription is filled and some pharmacies may offer cheaper prices.  The website www.goodrx.com contains coupons for medications through different pharmacies. The prices here do not account for what the cost may be with help from insurance (it may be cheaper with your insurance), but the website can give you the price if you did not use any insurance.  - You can print the associated coupon and take it with your prescription to the pharmacy.  - You may also stop by our office during regular business hours and pick up a GoodRx coupon card.  - If you need your prescription sent electronically to a different pharmacy, notify our office through [facility] or by phone at [phone] option 4.     Si Usted Necesita Algo Despus de Su Visita  Tambin puede enviarnos un mensaje a travs de Pharmacist, community. Por lo general respondemos a los mensajes de MyChart en el transcurso de 1 a 2 das hbiles.  Para renovar recetas, por favor pida a su farmacia que se ponga en contacto con nuestra oficina. Harland Dingwall de fax es [place] [phone].  Si tiene un asunto urgente cuando la clnica est cerrada y que no puede esperar hasta el siguiente da hbil, puede llamar/localizar a su doctor(a) al nmero  que aparece a continuacin.   Por favor, tenga en cuenta que aunque hacemos todo lo posible para estar disponibles para asuntos urgentes fuera del horario de [place], no estamos disponibles las 24 horas del da, los 7 das de la [place].   Si tiene un problema urgente y no puede comunicarse con nosotros, puede optar por buscar atencin mdica  en el consultorio de su doctor(a), en una clnica privada, en un centro de atencin urgente o en una sala de emergencias.  Si tiene Engineering geologist, por favor llame inmediatamente al 911 o vaya a la sala de emergencias.  Nmeros de bper  - Dr. Nehemiah Massed: [phone]  - Dra. Moye: [phone]  - Dra. Nicole Kindred: [phone]  En caso de inclemencias del [place], por favor llame a Johnsie Kindred principal al [phone] para una actualizacin sobre el [place] de cualquier retraso o cierre.  Consejos para la medicacin en dermatologa: Por favor,  guarde las cajas en las que vienen los medicamentos de uso tpico para ayudarle a seguir las instrucciones sobre dnde y cmo usarlos. Las farmacias generalmente imprimen las instrucciones del medicamento slo en las cajas y no directamente en los tubos del [place].   Si su medicamento es muy caro, por favor, pngase en contacto con Zigmund Daniel llamando al [phone] y presione la opcin 4 o envenos un mensaje a travs de Pharmacist, community.   No podemos decirle cul ser su copago por los medicamentos por adelantado ya que esto es diferente dependiendo de la cobertura de su seguro. Sin embargo, es posible que podamos encontrar un medicamento sustituto a Electrical engineer un formulario para que el seguro cubra el medicamento que se considera necesario.   Si se requiere una autorizacin previa para que su compaa de seguros [place] su medicamento, por favor permtanos de 1 a 2 das hbiles para completar este proceso.  Los precios de los medicamentos varan con frecuencia dependiendo del Environmental consultant de dnde se  surte la receta y alguna farmacias pueden ofrecer precios ms baratos.  El sitio web www.goodrx.com tiene cupones para medicamentos de Airline pilot. Los precios aqu no tienen en cuenta lo que podra costar con la ayuda del seguro (puede ser ms barato con su seguro), pero el sitio web puede darle el precio si no utiliz Research scientist (physical sciences).  - Puede imprimir el cupn correspondiente y llevarlo con su receta a la farmacia.  - Tambin puede pasar por nuestra oficina durante el horario de atencin regular y Charity fundraiser una tarjeta de cupones de GoodRx.  - Si necesita que su receta se enve electrnicamente a una farmacia diferente, informe a nuestra oficina a travs de MyChart de [place] o por telfono llamando al [phone] y presione la opcin 4.

## 2021-05-23 NOTE — Progress Notes (Signed)
New Patient Visit  Subjective  Maria Cohen is a 35 y.o. female who presents for the following: FBSE (Patient here for full body skin exam and skin cancer screening. Patient with a new spot at right calf, present for about 4 months. Patient with fhx of AK's in her mother. ).   The following portions of the chart were reviewed this encounter and updated as appropriate:   Tobacco  Allergies  Meds  Problems  Med Hx  Surg Hx  Fam Hx      Review of Systems:  No other skin or systemic complaints except as noted in HPI or Assessment and Plan.  Objective  Well appearing patient in no apparent distress; mood and affect are within normal limits.  A full examination was performed including scalp, head, eyes, ears, nose, lips, neck, chest, axillae, abdomen, back, buttocks, bilateral upper extremities, bilateral lower extremities, hands, feet, fingers, toes, fingernails, and toenails. All findings within normal limits unless otherwise noted below.  Left 3rd toe medial 0.8 cm thin soft medium brown papule      Assessment & Plan  Other viral warts right calf  Favor wart, call if not resolved Discussed viral etiology and risk of spread.  Discussed multiple treatments may be required to clear warts.  Discussed possible post-treatment dyspigmentation and risk of recurrence.  Prior to procedure, discussed risks of blister formation, small wound, skin dyspigmentation, or rare scar following cryotherapy. Recommend Vaseline ointment to treated areas while healing.   Destruction of lesion - right calf  Destruction method: cryotherapy   Informed consent: discussed and consent obtained   Lesion destroyed using liquid nitrogen: Yes   Cryotherapy cycles:  2 Outcome: patient tolerated procedure well with no complications   Post-procedure details: wound care instructions given    Nevus Left 3rd toe medial  Benign-appearing.  Observation.  Call clinic for new or changing lesions.   Recommend daily use of broad spectrum spf 30+ sunscreen to sun-exposed areas.    Lentigines - Scattered tan macules - Due to sun exposure - Benign-appearing, observe - Recommend daily broad spectrum sunscreen SPF 30+ to sun-exposed areas, reapply every 2 hours as needed. - Call for any changes  Seborrheic Keratoses - Stuck-on, waxy, tan-brown papules and/or plaques  - Benign-appearing - Discussed benign etiology and prognosis. - Observe - Call for any changes  Melanocytic Nevi - Tan-brown and/or pink-flesh-colored symmetric macules and papules - Benign appearing on exam today - Observation - Call clinic for new or changing moles - Recommend daily use of broad spectrum spf 30+ sunscreen to sun-exposed areas.   Hemangiomas - Red papules - Discussed benign nature - Observe - Call for any changes  Actinic Damage - Chronic condition, secondary to cumulative UV/sun exposure - diffuse scaly erythematous macules with underlying dyspigmentation - Recommend daily broad spectrum sunscreen SPF 30+ to sun-exposed areas, reapply every 2 hours as needed.  - Staying in the shade or wearing long sleeves, sun glasses (UVA+UVB protection) and wide brim hats (4-inch brim around the entire circumference of the hat) are also recommended for sun protection.  - Call for new or changing lesions.  Skin cancer screening performed today.  Nevus Spilus - Brown macules or papules within lighter tan patch at left upper back - Genetic - Benign, observe - Call for any changes   Return for as scheduled.  Graciella Belton, RMA, am acting as scribe for Forest Gleason, MD .  Documentation: I have reviewed the above documentation for accuracy and completeness,  and I agree with the above.  Forest Gleason, MD

## 2021-06-05 ENCOUNTER — Encounter: Payer: Self-pay | Admitting: Dermatology

## 2021-06-05 ENCOUNTER — Encounter: Payer: Self-pay | Admitting: Obstetrics and Gynecology

## 2021-06-14 ENCOUNTER — Other Ambulatory Visit: Payer: Self-pay | Admitting: Internal Medicine

## 2021-06-14 ENCOUNTER — Other Ambulatory Visit (INDEPENDENT_AMBULATORY_CARE_PROVIDER_SITE_OTHER): Payer: 59

## 2021-06-14 DIAGNOSIS — N939 Abnormal uterine and vaginal bleeding, unspecified: Secondary | ICD-10-CM

## 2021-06-14 LAB — HCG, QUANTITATIVE, PREGNANCY: Quantitative HCG: 10721 m[IU]/mL

## 2021-06-14 NOTE — Addendum Note (Signed)
Addended by: Azzie Almas on: 06/14/2021 07:25 AM   Modules accepted: Orders

## 2021-06-18 DIAGNOSIS — N939 Abnormal uterine and vaginal bleeding, unspecified: Secondary | ICD-10-CM | POA: Diagnosis not present

## 2021-06-19 ENCOUNTER — Encounter: Payer: Self-pay | Admitting: Internal Medicine

## 2021-06-19 DIAGNOSIS — N939 Abnormal uterine and vaginal bleeding, unspecified: Secondary | ICD-10-CM

## 2021-06-19 DIAGNOSIS — O209 Hemorrhage in early pregnancy, unspecified: Secondary | ICD-10-CM

## 2021-06-19 DIAGNOSIS — O200 Threatened abortion: Secondary | ICD-10-CM

## 2021-06-19 LAB — BETA HCG QUANT (REF LAB): hCG Quant: 11489 m[IU]/mL

## 2021-06-21 ENCOUNTER — Other Ambulatory Visit (INDEPENDENT_AMBULATORY_CARE_PROVIDER_SITE_OTHER): Payer: 59

## 2021-06-21 DIAGNOSIS — N939 Abnormal uterine and vaginal bleeding, unspecified: Secondary | ICD-10-CM

## 2021-06-21 LAB — HCG, QUANTITATIVE, PREGNANCY: Quantitative HCG: 8456 m[IU]/mL

## 2021-06-24 NOTE — Telephone Encounter (Signed)
Called pt and told her to keep appt with Opal Sidles. If necessary, Opal Sidles can discuss with MD.

## 2021-06-24 NOTE — Telephone Encounter (Signed)
Pt has appt with you Mon at 8:30. I'll find you to discuss before appt.

## 2021-06-25 ENCOUNTER — Ambulatory Visit (INDEPENDENT_AMBULATORY_CARE_PROVIDER_SITE_OTHER): Payer: 59 | Admitting: Advanced Practice Midwife

## 2021-06-25 ENCOUNTER — Other Ambulatory Visit: Payer: Self-pay

## 2021-06-25 ENCOUNTER — Encounter: Payer: Self-pay | Admitting: Advanced Practice Midwife

## 2021-06-25 VITALS — BP 120/80 | Ht 65.0 in | Wt 143.0 lb

## 2021-06-25 DIAGNOSIS — O3680X0 Pregnancy with inconclusive fetal viability, not applicable or unspecified: Secondary | ICD-10-CM | POA: Diagnosis not present

## 2021-06-26 LAB — BETA HCG QUANT (REF LAB): hCG Quant: 2871 m[IU]/mL

## 2021-06-27 ENCOUNTER — Other Ambulatory Visit: Payer: Self-pay | Admitting: Obstetrics and Gynecology

## 2021-06-27 ENCOUNTER — Other Ambulatory Visit (HOSPITAL_COMMUNITY): Payer: Self-pay | Admitting: Obstetrics and Gynecology

## 2021-06-27 ENCOUNTER — Ambulatory Visit (HOSPITAL_COMMUNITY)
Admission: RE | Admit: 2021-06-27 | Discharge: 2021-06-27 | Disposition: A | Discharge status: Home / Self Care | Payer: 59 | Source: Ambulatory Visit | Attending: Obstetrics and Gynecology | Admitting: Obstetrics and Gynecology

## 2021-06-27 ENCOUNTER — Other Ambulatory Visit: Payer: Self-pay

## 2021-06-27 DIAGNOSIS — O200 Threatened abortion: Secondary | ICD-10-CM | POA: Diagnosis not present

## 2021-06-27 DIAGNOSIS — O209 Hemorrhage in early pregnancy, unspecified: Secondary | ICD-10-CM

## 2021-06-27 DIAGNOSIS — Z3A01 Less than 8 weeks gestation of pregnancy: Secondary | ICD-10-CM | POA: Diagnosis not present

## 2021-06-27 NOTE — Progress Notes (Signed)
FYI

## 2021-06-28 ENCOUNTER — Encounter: Payer: Self-pay | Admitting: Advanced Practice Midwife

## 2021-06-28 NOTE — Progress Notes (Signed)
Patient ID: Maria Cohen, female   DOB: [birth year], 35 y.o.   MRN: [id number]  Reason for Consult: Miscarriage (06/07/21 started bleeeding)   Subjective:  Date of Service: 06/25/2021  HPI:  Maria Cohen is a 35 y.o. female being seen for likely miscarriage of early pregnancy. She started having some bleeding on 12/1 which was heavy with clots and tissue at times. The bleeding has stopped in the past week. She denies any pain currently. She is scheduled for an ultrasound on Wednesday of this week and we will trend Beta Hcg today and on Wednesday. She and her husband are hopeful to conceive again and they have questions regarding genetic testing. They are advised that we can do Inheritest carrier testing with a future pregnancy.   Past Medical History:  Diagnosis Date   Chronic constipation    Raynaud's disease    Family History  Problem Relation Age of Onset   Osteopenia Mother    COPD Father    Lung cancer Father    Breast cancer Maternal Grandmother 65   Kidney disease Maternal Grandmother    Past Surgical History:  Procedure Laterality Date   FINGER SURGERY     Rheumatoid nodule   WISDOM TOOTH EXTRACTION      Short Social History:  Social History   Tobacco Use   Smoking status: Never   Smokeless tobacco: Never  Substance Use Topics   Alcohol use: Yes    Comment: ocasionally    No Known Allergies  No current outpatient medications on file.   No current facility-administered medications for this visit.   Review of Systems  Constitutional:  Negative for chills and fever.  HENT:  Negative for congestion, ear discharge, ear pain, hearing loss, sinus pain and sore throat.   Eyes:  Negative for blurred vision and double vision.  Respiratory:  Negative for cough, shortness of breath and wheezing.   Cardiovascular:  Negative for chest pain, palpitations and leg swelling.  Gastrointestinal:  Negative for abdominal pain, blood in stool, constipation, diarrhea,  heartburn, melena, nausea and vomiting.  Genitourinary:  Negative for dysuria, flank pain, frequency, hematuria and urgency.  Musculoskeletal:  Negative for back pain, joint pain and myalgias.  Skin:  Negative for itching and rash.  Neurological:  Negative for dizziness, tingling, tremors, sensory change, speech change, focal weakness, seizures, loss of consciousness, weakness and headaches.  Endo/Heme/Allergies:  Negative for environmental allergies. Does not bruise/bleed easily.  Psychiatric/Behavioral:  Negative for depression, hallucinations, memory loss, substance abuse and suicidal ideas. The patient is not nervous/anxious and does not have insomnia.        Objective:  Objective   Vitals:   06/25/21 0800  BP: 120/80  Weight: 143 lb (64.9 kg)  Height: 5\' 5"  (1.651 m)   Body mass index is 23.8 kg/m. Constitutional: Well nourished, well developed female in no acute distress.  HEENT: normal Skin: Warm and dry.  Cardiovascular: Regular rate and rhythm.   Extremity:  no edema   Respiratory: Clear to auscultation bilateral. Normal respiratory effort Abdomen: soft, nontender, nondistended Neuro: DTRs 2+, Cranial nerves grossly intact Psych: Alert and Oriented x3. No memory deficits. Normal mood and affect.   Update to visit:  Latest Reference Range & Units 06/25/21 09:14  hCG Quant mIU/mL 2,871    CLINICAL DATA:  Bleeding with pregnancy. Threatened abortion. Decreased beta HCG compared to 7 days ago.   EXAM: OBSTETRIC <14 WK [place] AND TRANSVAGINAL OB US   TECHNIQUE: Both transabdominal and  transvaginal ultrasound examinations were performed for complete evaluation of the gestation as well as the maternal uterus, adnexal regions, and pelvic cul-de-sac. Transvaginal technique was performed to assess early pregnancy.   COMPARISON:  None.   FINDINGS: Intrauterine gestational sac: Heterogeneous endometrium with possible gestational sac in the cervix. Example at 6 mm.    Yolk sac:  Absent   Embryo:  Absent   Cardiac Activity: Absent   MSD: 6 mm   5 w   2 d   Subchorionic hemorrhage:  None visualized.   Maternal uterus/adnexae: Right ovary not visualized. Normal left ovary, without adnexal mass.   No significant free fluid.   IMPRESSION: No evidence of intrauterine pregnancy. Fluid within the uterine cervix could represent a gestational sac, suggesting abortion in progress.   No evidence of ectopic pregnancy.   Right ovary not visualized.   Electronically Signed   By: Abigail Miyamoto M.D.   On: 06/27/2021 15:24    Time spent caring for patient with greater than 50% in consultation: 15 minutes Assessment/Plan:     35 y.o. G1 P0010 with likely miscarriage  Ultrasound scheduled to rule out ectopic Trend Beta Hcg Follow up as needed   [facility] 06/28/2021, 1:34 PM

## 2021-06-29 ENCOUNTER — Other Ambulatory Visit (INDEPENDENT_AMBULATORY_CARE_PROVIDER_SITE_OTHER): Payer: 59

## 2021-06-29 DIAGNOSIS — O3680X0 Pregnancy with inconclusive fetal viability, not applicable or unspecified: Secondary | ICD-10-CM | POA: Diagnosis not present

## 2021-06-29 LAB — HCG, QUANTITATIVE, PREGNANCY: Quantitative HCG: 3536 m[IU]/mL

## 2021-07-11 ENCOUNTER — Encounter: Payer: Self-pay | Admitting: Dermatology

## 2021-07-11 ENCOUNTER — Ambulatory Visit (INDEPENDENT_AMBULATORY_CARE_PROVIDER_SITE_OTHER): Payer: 59 | Admitting: Dermatology

## 2021-07-11 ENCOUNTER — Other Ambulatory Visit: Payer: Self-pay

## 2021-07-11 DIAGNOSIS — L905 Scar conditions and fibrosis of skin: Secondary | ICD-10-CM | POA: Diagnosis not present

## 2021-07-11 NOTE — Patient Instructions (Addendum)
Recommend Serica moisturizing scar formula cream every night or [facility] brand or Mederma silicone scar sheet every night for the first year after a scar appears to help with scar remodeling if desired. Scars remodel on their own for a full year.   Recommend taking Heliocare sun protection supplement daily in sunny weather for additional sun protection. For maximum protection on the sunniest days, you can take up to 2 capsules of regular Heliocare OR take 1 capsule of Heliocare Ultra. For prolonged exposure (such as a full day in the sun), you can repeat your dose of the supplement 4 hours after your first dose. Heliocare can be purchased at [facility] or at [email or web address].    If You Need Anything After Your Visit  If you have any questions or concerns for your doctor, please call our main line at [phone] and press option 4 to reach your doctor's medical assistant. If no one answers, please leave a voicemail as directed and we will return your call as soon as possible. Messages left after 4 pm will be answered the following business day.   You may also send [place] a message via [place]. We typically respond to MyChart messages within 1-2 business days.  For prescription refills, please ask your pharmacy to contact our office. Our fax number is [phone].  If you have an urgent issue when the clinic is closed that cannot wait until the next business day, you can page your doctor at the number below.    Please note that while we do our best to be available for urgent issues outside of office hours, we are not available 24/7.   If you have an urgent issue and are unable to reach [place], you may choose to seek medical care at your doctor's office, retail clinic, urgent care center, or emergency room.  If you have a medical emergency, please immediately call 911 or go to the emergency department.  Pager Numbers  - Dr. Nehemiah Massed: [phone]  - Dr. Laurence Ferrari: [phone]  - Dr. Nicole Kindred:  [phone]  In the event of inclement weather, please call our main line at [phone] for an update on the status of any delays or closures.  Dermatology Medication Tips: Please keep the boxes that topical medications come in in order to help keep track of the instructions about where and how to use these. Pharmacies typically print the medication instructions only on the boxes and not directly on the medication tubes.   If your medication is too expensive, please contact our office at [phone] option 4 or send [place] a message through [place].   We are unable to tell what your co-pay for medications will be in advance as this is different depending on your insurance coverage. However, we may be able to find a substitute medication at lower cost or fill out paperwork to get insurance to cover a needed medication.   If a prior authorization is required to get your medication covered by your insurance company, please allow [place] 1-2 business days to complete this process.  Drug prices often vary depending on where the prescription is filled and some pharmacies may offer cheaper prices.  The website www.goodrx.com contains coupons for medications through different pharmacies. The prices here do not account for what the cost may be with help from insurance (it may be cheaper with your insurance), but the website can give you the price if you did not use any insurance.  - You can print the associated coupon and take it  with your prescription to the pharmacy.  - You may also stop by our office during regular business hours and pick up a GoodRx coupon card.  - If you need your prescription sent electronically to a different pharmacy, notify our office through [facility] or by phone at [phone] option 4.     Si Usted Necesita Algo Despus de Su Visita  Tambin puede enviarnos un mensaje a travs de Pharmacist, community. Por lo general respondemos a los mensajes de MyChart en el transcurso de 1 a 2  das hbiles.  Para renovar recetas, por favor pida a su farmacia que se ponga en contacto con nuestra oficina. Harland Dingwall de fax es [place] [phone].  Si tiene un asunto urgente cuando la clnica est cerrada y que no puede esperar hasta el siguiente da hbil, puede llamar/localizar a su doctor(a) al nmero que aparece a continuacin.   Por favor, tenga en cuenta que aunque hacemos todo lo posible para estar disponibles para asuntos urgentes fuera del horario de [place], no estamos disponibles las 24 horas del da, los 7 das de la [place].   Si tiene un problema urgente y no puede comunicarse con nosotros, puede optar por buscar atencin mdica  en el consultorio de su doctor(a), en una clnica privada, en un centro de atencin urgente o en una sala de emergencias.  Si tiene Engineering geologist, por favor llame inmediatamente al 911 o vaya a la sala de emergencias.  Nmeros de bper  - Dr. Nehemiah Massed: [phone]  - Dra. Moye: [phone]  - Dra. Nicole Kindred: [phone]  En caso de inclemencias del [place], por favor llame a Johnsie Kindred principal al [phone] para una actualizacin sobre el [place] de cualquier retraso o cierre.  Consejos para la medicacin en dermatologa: Por favor, guarde las cajas en las que vienen los medicamentos de uso tpico para ayudarle a seguir las instrucciones sobre dnde y cmo usarlos. Las farmacias generalmente imprimen las instrucciones del medicamento slo en las cajas y no directamente en los tubos del [place].   Si su medicamento es muy caro, por favor, pngase en contacto con Zigmund Daniel llamando al [phone] y presione la opcin 4 o envenos un mensaje a travs de Pharmacist, community.   No podemos decirle cul ser su copago por los medicamentos por adelantado ya que esto es diferente dependiendo de la cobertura de su seguro. Sin embargo, es posible que podamos encontrar un medicamento sustituto a Electrical engineer un formulario para que el  seguro cubra el medicamento que se considera necesario.   Si se requiere una autorizacin previa para que su compaa de seguros [place] su medicamento, por favor permtanos de 1 a 2 das hbiles para completar este proceso.  Los precios de los medicamentos varan con frecuencia dependiendo del Environmental consultant de dnde se surte la receta y alguna farmacias pueden ofrecer precios ms baratos.  El sitio web www.goodrx.com tiene cupones para medicamentos de Airline pilot. Los precios aqu no tienen en cuenta lo que podra costar con la ayuda del seguro (puede ser ms barato con su seguro), pero el sitio web puede darle el precio si no utiliz Research scientist (physical sciences).  - Puede imprimir el cupn correspondiente y llevarlo con su receta a la farmacia.  - Tambin puede pasar por nuestra oficina durante el horario de atencin regular y Charity fundraiser una tarjeta de cupones de GoodRx.  - Si necesita que su receta se enve electrnicamente a Chiropodist, informe a nuestra oficina a travs de MyChart de [facility] o por telfono llamando al [phone] y presione la opcin 4.

## 2021-07-11 NOTE — Progress Notes (Signed)
° °  Follow-Up Visit   Subjective  MARGRIT MINNER is a 36 y.o. female who presents for the following: Follow-up (6 week recheck. Wart vs other. Right calf. Tx with LN2 ).  The following portions of the chart were reviewed this encounter and updated as appropriate:  Tobacco   Allergies   Meds   Problems   Med Hx   Surg Hx   Fam Hx       Review of Systems: No other skin or systemic complaints except as noted in HPI or Assessment and Plan.   Objective  Well appearing patient in no apparent distress; mood and affect are within normal limits.  A focused examination was performed including face, right calf. Relevant physical exam findings are noted in the Assessment and Plan.  Right Lower Leg - Posterior Atrophic pink macule   Assessment & Plan  Scar Right Lower Leg - Posterior  No residual wart. Observe for recurrence.  Recommend Serica moisturizing scar formula cream every night or [facility] brand or Mederma silicone scar sheet every night for the first year after a scar appears to help with scar remodeling if desired. Scars remodel on their own for a full year.    Return for TBSE as scheduled.  I, Emelia Salisbury, CMA, am acting as scribe for Forest Gleason, MD.  Documentation: I have reviewed the above documentation for accuracy and completeness, and I agree with the above.  Forest Gleason, MD

## 2021-07-18 ENCOUNTER — Encounter: Payer: Self-pay | Admitting: Dermatology

## 2021-07-30 ENCOUNTER — Encounter: Payer: Self-pay | Admitting: Internal Medicine

## 2021-07-30 ENCOUNTER — Ambulatory Visit (INDEPENDENT_AMBULATORY_CARE_PROVIDER_SITE_OTHER): Payer: 59 | Admitting: Internal Medicine

## 2021-07-30 ENCOUNTER — Other Ambulatory Visit: Payer: Self-pay

## 2021-07-30 VITALS — BP 117/76 | HR 70 | Resp 18 | Wt 143.0 lb

## 2021-07-30 DIAGNOSIS — O039 Complete or unspecified spontaneous abortion without complication: Secondary | ICD-10-CM | POA: Diagnosis not present

## 2021-07-30 DIAGNOSIS — I7300 Raynaud's syndrome without gangrene: Secondary | ICD-10-CM

## 2021-07-30 DIAGNOSIS — N939 Abnormal uterine and vaginal bleeding, unspecified: Secondary | ICD-10-CM | POA: Diagnosis not present

## 2021-07-30 DIAGNOSIS — K5909 Other constipation: Secondary | ICD-10-CM

## 2021-07-30 DIAGNOSIS — R41840 Attention and concentration deficit: Secondary | ICD-10-CM

## 2021-07-30 MED ORDER — AMPHETAMINE-DEXTROAMPHETAMINE 15 MG PO TABS
15.0000 mg | ORAL_TABLET | Freq: Two times a day (BID) | ORAL | 0 refills | Status: DC
  Filled 2021-07-30: qty 60, 30d supply, fill #0

## 2021-07-30 NOTE — Assessment & Plan Note (Signed)
Not medicated Not a major clinical concern for her Will monitor

## 2021-07-30 NOTE — Patient Instructions (Signed)

## 2021-07-30 NOTE — Progress Notes (Signed)
Subjective:    Patient ID: Maria Cohen, female    DOB: [birth year], 36 y.o.   MRN: [id number]  HPI  Patient presents to clinic today for follow up of chronic condiitons.  Chronic Constipation: She has a BM 2-3 times a week. She is not currently taking any OTC or RX medications for this. She has never had a colonoscopy.  Raynaud's: This can flare up during the winter. She is not taking any medications for this. She keeps her hands as warm as she can.  Difficulty Concenrtrating: Managed on Adderall. She reports her dose is effective. She will graduate May 2023. She plans to discontinue this medication once she graduates.  She did become pregnant 06/2021, ended up having a spontaneous miscarriage. She continues to have vaginal bleeding, ranging from spotting to similar to a period. Her last HCG level was 3536, 06/29/2021. Ultrasound from 06/27/21 reviewed.  Review of Systems  Past Medical History:  Diagnosis Date   Chronic constipation    Raynaud's disease     No current outpatient medications on file.   No current facility-administered medications for this visit.    No Known Allergies  Family History  Problem Relation Age of Onset   Osteopenia Mother    COPD Father    Lung cancer Father    Breast cancer Maternal Grandmother 79   Kidney disease Maternal Grandmother     Social History   Socioeconomic History   Marital status: Married    Spouse name: Not on file   Number of children: Not on file   Years of education: Not on file   Highest education level: Not on file  Occupational History   Not on file  Tobacco Use   Smoking status: Never   Smokeless tobacco: Never  Vaping Use   Vaping Use: Never used  Substance and Sexual Activity   Alcohol use: Yes    Comment: ocasionally   Drug use: No   Sexual activity: Yes    Birth control/protection: None  Other Topics Concern   Not on file  Social History Narrative   Not on file   Social Determinants of  Health   Financial Resource Strain: Not on file  Food Insecurity: Not on file  Transportation Needs: Not on file  Physical Activity: Not on file  Stress: Not on file  Social Connections: Not on file  Intimate Partner Violence: Not on file     Constitutional: Denies fever, malaise, fatigue, headache or abrupt weight changes.  HEENT: Denies eye pain, eye redness, ear pain, ringing in the ears, wax buildup, runny nose, nasal congestion, bloody nose, or sore throat. Respiratory: Denies difficulty breathing, shortness of breath, cough or sputum production.   Cardiovascular: Denies chest pain, chest tightness, palpitations or swelling in the hands or feet.  Gastrointestinal: Patient reports chronic constipation.  Denies abdominal pain, bloating, constipation, diarrhea or blood in the stool.  GU: Pt reports vaginal bleeding. Denies urgency, frequency, pain with urination, burning sensation, blood in urine, odor or discharge. Musculoskeletal: Denies decrease in range of motion, difficulty with gait, muscle pain or joint pain and swelling.  Skin: Denies redness, rashes, lesions or ulcercations.  Neurological: Pt reports inattention. Denies dizziness, difficulty with memory, difficulty with speech or problems with balance and coordination.  Psych: Denies anxiety, depression, SI/HI.  No other specific complaints in a complete review of systems (except as listed in HPI above).     Objective:   Physical Exam  BP 117/76    Pulse  70    Resp 18    Wt 143 lb (64.9 kg)    SpO2 100%    BMI 23.80 kg/m   Wt Readings from Last 3 Encounters:  06/25/21 143 lb (64.9 kg)  01/29/21 143 lb (64.9 kg)  11/14/20 140 lb (63.5 kg)    General: Appears her stated age, well developed, well nourished in NAD. Skin: Warm, dry and intact.  Cardiovascular: Normal rate. Pulmonary/Chest: Normal effort. Abdomen: Soft and nontender. Normal bowel sounds.  Neurological: Alert and oriented.  Psychiatric: Mood and affect  normal. Behavior is normal. Judgment and thought content normal.    BMET    Component Value Date/Time   NA 137 04/01/2019 1050   NA 141 01/21/2019 1527   K 5.0 04/01/2019 1050   CL 102 04/01/2019 1050   CO2 25 04/01/2019 1050   GLUCOSE 100 (H) 04/01/2019 1050   BUN 16 04/01/2019 1050   BUN 10 01/21/2019 1527   CREATININE 0.92 04/01/2019 1050   CALCIUM 9.9 04/01/2019 1050   GFRNONAA 81 04/01/2019 1050   GFRAA 93 04/01/2019 1050    Lipid Panel     Component Value Date/Time   CHOL 192 04/01/2019 1050   CHOL 206 (H) 01/21/2019 1527   TRIG 64 04/01/2019 1050   HDL 86 04/01/2019 1050   HDL 88 01/21/2019 1527   CHOLHDL 2.2 04/01/2019 1050   LDLCALC 91 04/01/2019 1050    CBC    Component Value Date/Time   WBC 7.1 04/01/2019 1050   RBC 3.93 04/01/2019 1050   HGB 12.8 04/01/2019 1050   HCT 37.3 04/01/2019 1050   PLT 365 04/01/2019 1050   MCV 94.9 04/01/2019 1050   MCH 32.6 04/01/2019 1050   MCHC 34.3 04/01/2019 1050   RDW 12.6 04/01/2019 1050   LYMPHSABS 2,329 04/01/2019 1050   EOSABS 50 04/01/2019 1050   BASOSABS 99 04/01/2019 1050    Hgb A1C No results found for: HGBA1C         Assessment & Plan:   Vaginal Bleeding in the setting of Spontaneous Miscarriage:  Given persistent bleeding, will recheck HcG to make sure levels have returned to 0 Advised patient if they were still elevated, given persistent bleeding, may need D&C.  RTC as needed  Webb Silversmith, NP This visit occurred during the SARS-CoV-2 public health emergency.  Safety protocols were in place, including screening questions prior to the visit, additional usage of staff PPE, and extensive cleaning of exam room while observing appropriate contact time as indicated for disinfecting solutions.

## 2021-07-30 NOTE — Assessment & Plan Note (Signed)
Stable off meds Encouraged high fiber diet and adequate water intake

## 2021-07-30 NOTE — Assessment & Plan Note (Signed)
Adderall refilled today

## 2021-09-27 ENCOUNTER — Other Ambulatory Visit: Payer: Self-pay

## 2021-09-27 ENCOUNTER — Other Ambulatory Visit: Payer: Self-pay | Admitting: Internal Medicine

## 2021-09-27 MED ORDER — AMPHETAMINE-DEXTROAMPHETAMINE 15 MG PO TABS
15.0000 mg | ORAL_TABLET | Freq: Two times a day (BID) | ORAL | 0 refills | Status: DC
  Filled 2021-09-27: qty 60, 30d supply, fill #0

## 2022-01-07 ENCOUNTER — Other Ambulatory Visit: Payer: Self-pay

## 2022-01-07 ENCOUNTER — Other Ambulatory Visit: Payer: Self-pay | Admitting: Internal Medicine

## 2022-01-07 MED ORDER — AMPHETAMINE-DEXTROAMPHETAMINE 15 MG PO TABS
15.0000 mg | ORAL_TABLET | Freq: Two times a day (BID) | ORAL | 0 refills | Status: DC
  Filled 2022-01-07: qty 60, 30d supply, fill #0

## 2022-03-19 ENCOUNTER — Ambulatory Visit: Payer: 59 | Admitting: Obstetrics and Gynecology

## 2022-04-29 DIAGNOSIS — Z319 Encounter for procreative management, unspecified: Secondary | ICD-10-CM | POA: Diagnosis not present

## 2022-05-06 DIAGNOSIS — Z319 Encounter for procreative management, unspecified: Secondary | ICD-10-CM | POA: Diagnosis not present

## 2022-05-06 DIAGNOSIS — Z131 Encounter for screening for diabetes mellitus: Secondary | ICD-10-CM | POA: Diagnosis not present

## 2022-05-06 DIAGNOSIS — Z3149 Encounter for other procreative investigation and testing: Secondary | ICD-10-CM | POA: Diagnosis not present

## 2022-05-12 ENCOUNTER — Other Ambulatory Visit: Payer: Self-pay | Admitting: Internal Medicine

## 2022-05-12 MED ORDER — PHENTERMINE HCL 37.5 MG PO CAPS: 37.5000 mg | ORAL_CAPSULE | ORAL | 1 refills | Status: DC

## 2022-05-24 DIAGNOSIS — N979 Female infertility, unspecified: Secondary | ICD-10-CM | POA: Diagnosis not present

## 2022-05-29 ENCOUNTER — Encounter: Payer: 59 | Admitting: Dermatology

## 2022-06-04 ENCOUNTER — Encounter: Payer: 59 | Admitting: Dermatology

## 2022-06-05 ENCOUNTER — Encounter: Payer: 59 | Admitting: Dermatology

## 2022-06-06 ENCOUNTER — Other Ambulatory Visit: Payer: Self-pay | Admitting: Internal Medicine

## 2022-06-11 DIAGNOSIS — N979 Female infertility, unspecified: Secondary | ICD-10-CM | POA: Diagnosis not present

## 2022-06-11 DIAGNOSIS — E282 Polycystic ovarian syndrome: Secondary | ICD-10-CM | POA: Diagnosis not present

## 2022-07-02 IMAGING — US US OB < 14 WEEKS - US OB TV
1 series · 14 of 28 positions shown · non-contrast
Comparison: None.

CLINICAL DATA: Bleeding with pregnancy. Threatened abortion.
Decreased beta HCG compared to 7 days ago.

EXAM:
OBSTETRIC <14 WK US AND TRANSVAGINAL OB US
TECHNIQUE: Both transabdominal and transvaginal ultrasound examinations were
performed for complete evaluation of the gestation as well as the
maternal uterus, adnexal regions, and pelvic cul-de-sac.
Transvaginal technique was performed to assess early pregnancy.

[Series 1: us ob < 14 weeks - us ob tv · 14 of 113 slices shown]
[im 5/113]
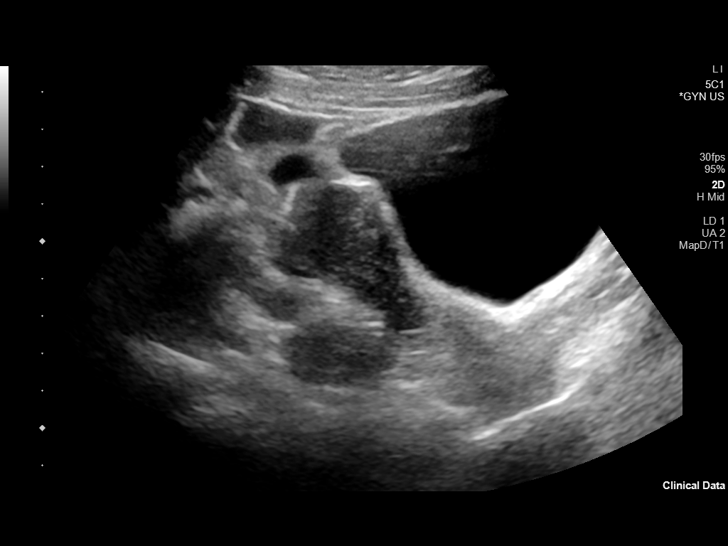
[im 13/113]
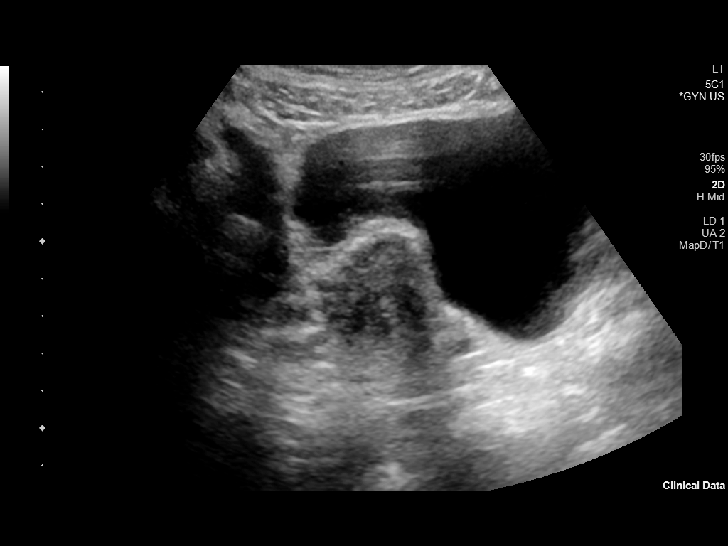
[im 21/113]
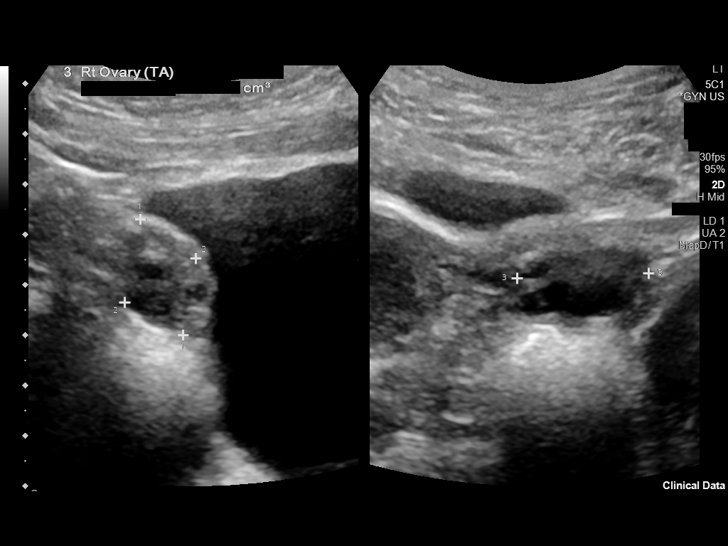
[im 30/113]
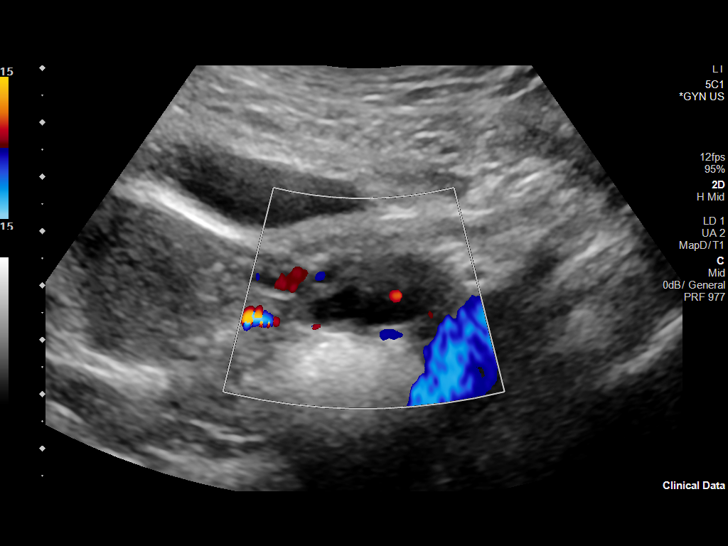
[im 38/113]
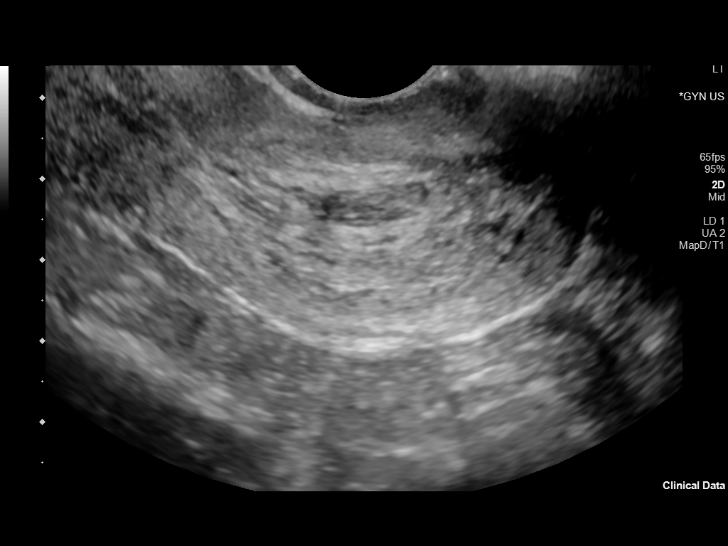
[im 46/113]
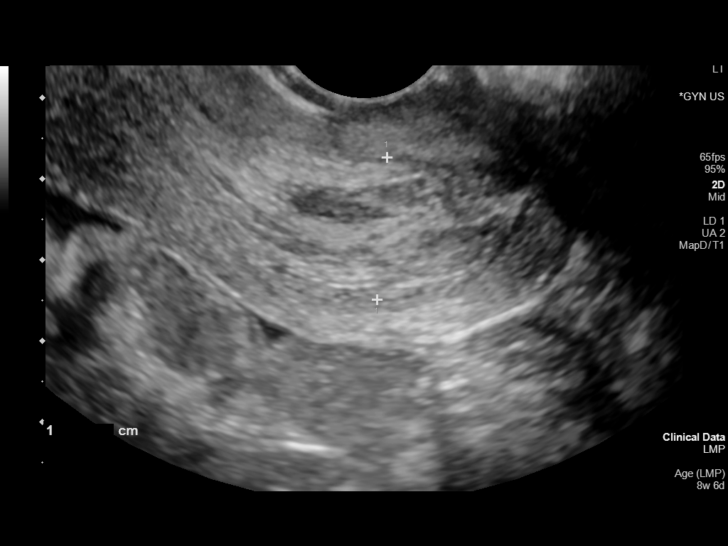
[im 54/113]
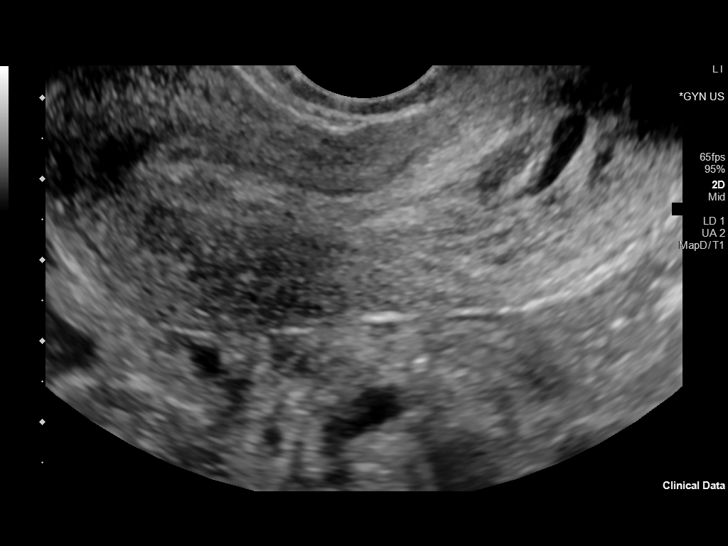
[im 63/113]
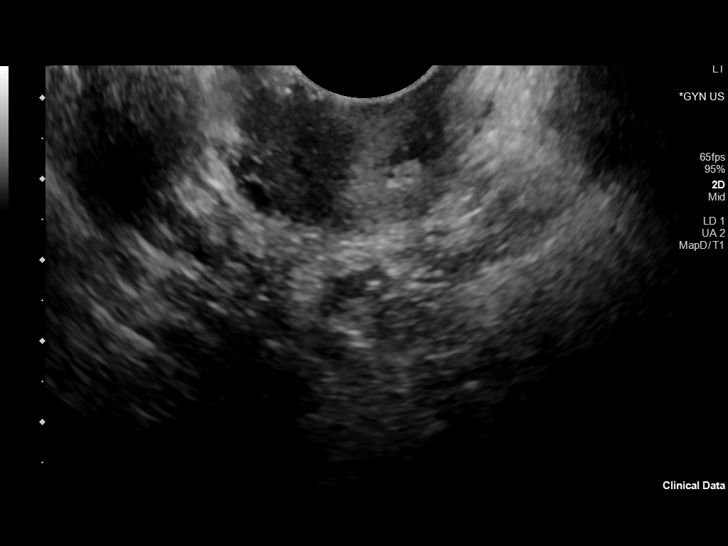
[im 71/113]
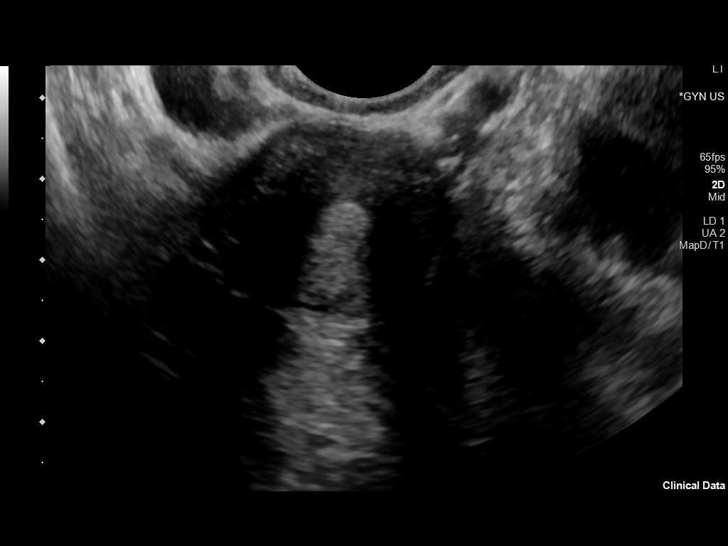
[im 79/113]
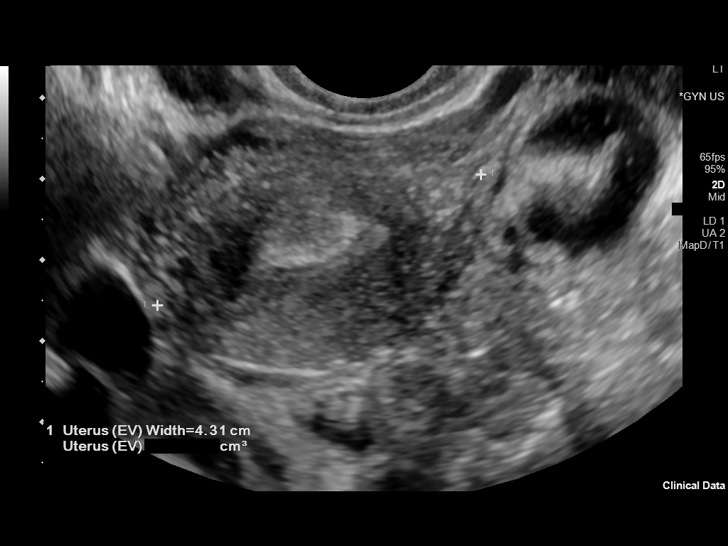
[im 88/113]
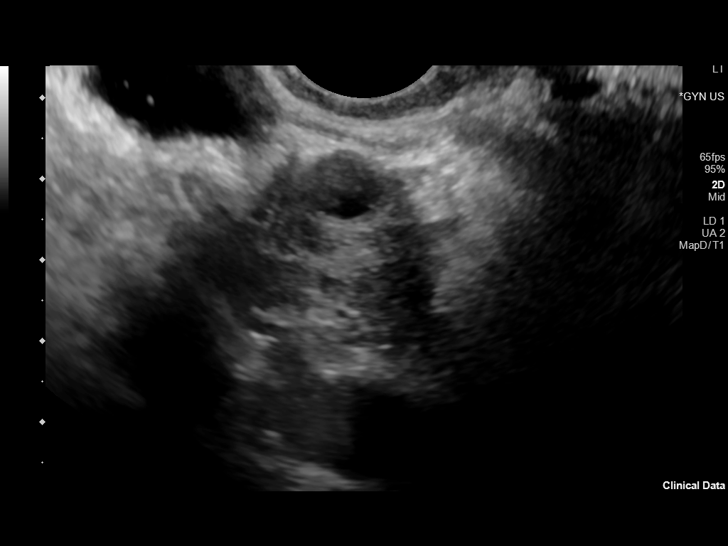
[im 96/113]
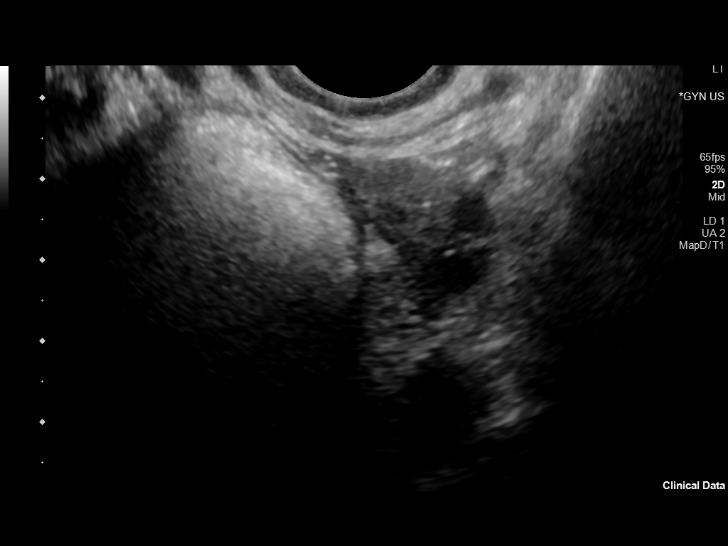
[im 104/113]
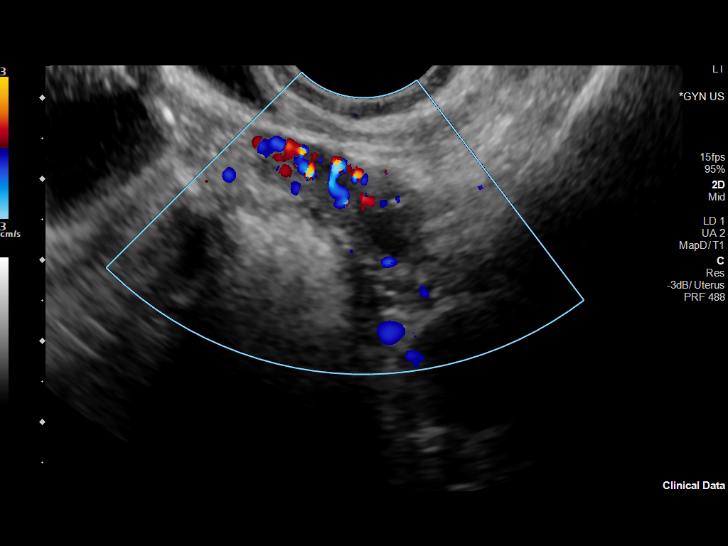
[im 113/113]
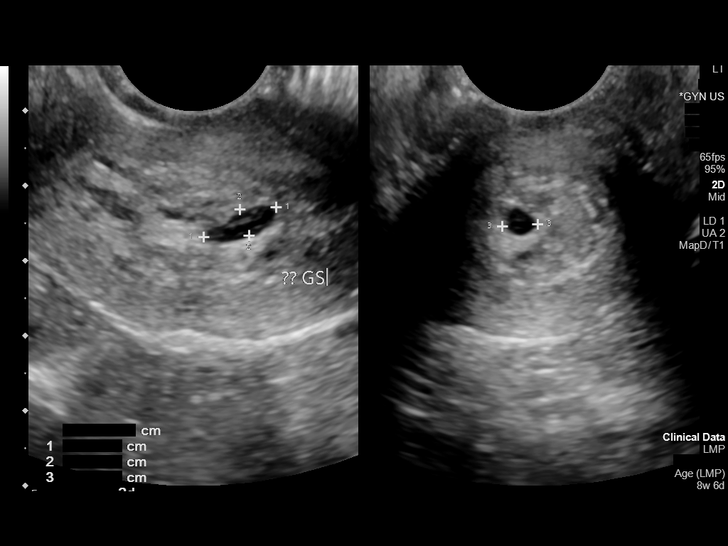

[14 of 28 positions shown; findings below may reference images not displayed]

FINDINGS: Intrauterine gestational sac: Heterogeneous endometrium with
possible gestational sac in the cervix. Example at 6 mm.

Yolk sac:  Absent

Embryo:  Absent

Cardiac Activity: Absent

MSD: 6 mm   5 w   2 d

Subchorionic hemorrhage:  None visualized.

Maternal uterus/adnexae: Right ovary not visualized. Normal left
ovary, without adnexal mass.

No significant free fluid.
IMPRESSION: No evidence of intrauterine pregnancy. Fluid within the uterine
cervix could represent a gestational sac, suggesting abortion in
progress.

No evidence of ectopic pregnancy.

Right ovary not visualized.

## 2022-07-10 ENCOUNTER — Ambulatory Visit (INDEPENDENT_AMBULATORY_CARE_PROVIDER_SITE_OTHER): Payer: 59 | Admitting: Dermatology

## 2022-07-10 ENCOUNTER — Encounter: Payer: Self-pay | Admitting: Dermatology

## 2022-07-10 VITALS — BP 129/72 | HR 74

## 2022-07-10 DIAGNOSIS — L578 Other skin changes due to chronic exposure to nonionizing radiation: Secondary | ICD-10-CM | POA: Diagnosis not present

## 2022-07-10 DIAGNOSIS — L821 Other seborrheic keratosis: Secondary | ICD-10-CM | POA: Diagnosis not present

## 2022-07-10 DIAGNOSIS — D235 Other benign neoplasm of skin of trunk: Secondary | ICD-10-CM

## 2022-07-10 DIAGNOSIS — L814 Other melanin hyperpigmentation: Secondary | ICD-10-CM | POA: Diagnosis not present

## 2022-07-10 DIAGNOSIS — D229 Melanocytic nevi, unspecified: Secondary | ICD-10-CM | POA: Diagnosis not present

## 2022-07-10 DIAGNOSIS — Z1283 Encounter for screening for malignant neoplasm of skin: Secondary | ICD-10-CM

## 2022-07-10 DIAGNOSIS — L853 Xerosis cutis: Secondary | ICD-10-CM | POA: Diagnosis not present

## 2022-07-10 NOTE — Patient Instructions (Addendum)
Recommend daily broad spectrum sunscreen SPF 30+ to sun-exposed areas, reapply every 2 hours as needed. Call for new or changing lesions.  Staying in the shade or wearing long sleeves, sun glasses (UVA+UVB protection) and wide brim hats (4-inch brim around the entire circumference of the hat) are also recommended for sun protection.    Recommend taking Heliocare sun protection supplement daily in sunny weather for additional sun protection. For maximum protection on the sunniest days, you can take up to 2 capsules of regular Heliocare OR take 1 capsule of Heliocare Ultra. For prolonged exposure (such as a full day in the sun), you can repeat your dose of the supplement 4 hours after your first dose. Heliocare can be purchased at [employer], at some [facility] or at [email or web address].     Melanoma ABCDEs  Melanoma is the most dangerous type of skin cancer, and is the leading cause of death from skin disease.  You are more likely to develop melanoma if you: Have light-colored skin, light-colored eyes, or red or blond hair Spend a lot of time in the sun Tan regularly, either outdoors or in a tanning bed Have had blistering sunburns, especially during childhood Have a close family member who has had a melanoma Have atypical moles or large birthmarks  Early detection of melanoma is key since treatment is typically straightforward and cure rates are extremely high if we catch it early.   The first sign of melanoma is often a change in a mole or a new dark spot.  The ABCDE system is a way of remembering the signs of melanoma.  A for asymmetry:  The two halves do not match. B for border:  The edges of the growth are irregular. C for color:  A mixture of colors are present instead of an even brown color. D for diameter:  Melanomas are usually (but not always) greater than 72m - the size of a pencil eraser. E for evolution:  The spot keeps changing in size, shape, and color.  Please check  your skin once per month between visits. You can use a small mirror in front and a large mirror behind you to keep an eye on the back side or your body.   If you see any new or changing lesions before your next follow-up, please call to schedule a visit.  Please continue daily skin protection including broad spectrum sunscreen SPF 30+ to sun-exposed areas, reapplying every 2 hours as needed when you're outdoors.   Staying in the shade or wearing long sleeves, sun glasses (UVA+UVB protection) and wide brim hats (4-inch brim around the entire circumference of the hat) are also recommended for sun protection.     Gentle Skin Care Guide  1. Bathe no more than once a day.  2. Avoid bathing in hot water  3. Use a mild soap like Dove, Vanicream, Cetaphil, CeraVe. Can use Lever 2000 or Cetaphil antibacterial soap  4. Use soap only where you need it. On most days, use it under your arms, between your legs, and on your feet. Let the water rinse other areas unless visibly dirty.  5. When you get out of the bath/shower, use a towel to gently blot your skin dry, don't rub it.  6. While your skin is still a little damp, apply a moisturizing cream such as Vanicream, CeraVe, Cetaphil, Eucerin, Sarna lotion or plain Vaseline Jelly. For hands apply Neutrogena [place]Hand Cream or Excipial Hand Cream.  7. Reapply moisturizer any time you  start to itch or feel dry.  8. Sometimes using free and clear laundry detergents can be helpful. Fabric softener sheets should be avoided. Downy Free & Gentle liquid, or any liquid fabric softener that is free of dyes and perfumes, it acceptable to use  9. If your doctor has given you prescription creams you may apply moisturizers over them       Due to recent changes in healthcare laws, you may see results of your pathology and/or laboratory studies on MyChart before the doctors have had a chance to review them. We understand that in some cases there may be results  that are confusing or concerning to you. Please understand that not all results are received at the same time and often the doctors may need to interpret multiple results in order to provide you with the best plan of care or course of treatment. Therefore, we ask that you please give [place] 2 business days to thoroughly review all your results before contacting the office for clarification. Should we see a critical lab result, you will be contacted sooner.   If You Need Anything After Your Visit  If you have any questions or concerns for your doctor, please call our main line at [phone] and press option 4 to reach your doctor's medical assistant. If no one answers, please leave a voicemail as directed and we will return your call as soon as possible. Messages left after 4 pm will be answered the following business day.   You may also send [place] a message via [place]. We typically respond to MyChart messages within 1-2 business days.  For prescription refills, please ask your pharmacy to contact our office. Our fax number is [phone].  If you have an urgent issue when the clinic is closed that cannot wait until the next business day, you can page your doctor at the number below.    Please note that while we do our best to be available for urgent issues outside of office hours, we are not available 24/7.   If you have an urgent issue and are unable to reach [place], you may choose to seek medical care at your doctor's office, retail clinic, urgent care center, or emergency room.  If you have a medical emergency, please immediately call 911 or go to the emergency department.  Pager Numbers  - Dr. Nehemiah Massed: [phone]  - Dr. Laurence Ferrari: [phone]  - Dr. Nicole Kindred: [phone]  In the event of inclement weather, please call our main line at [phone] for an update on the status of any delays or closures.  Dermatology Medication Tips: Please keep the boxes that topical medications come in in  order to help keep track of the instructions about where and how to use these. Pharmacies typically print the medication instructions only on the boxes and not directly on the medication tubes.   If your medication is too expensive, please contact our office at [phone] option 4 or send [place] a message through [place].   We are unable to tell what your co-pay for medications will be in advance as this is different depending on your insurance coverage. However, we may be able to find a substitute medication at lower cost or fill out paperwork to get insurance to cover a needed medication.   If a prior authorization is required to get your medication covered by your insurance company, please allow [place] 1-2 business days to complete this process.  Drug prices often vary depending on where the prescription is filled  and some pharmacies may offer cheaper prices.  The website www.goodrx.com contains coupons for medications through different pharmacies. The prices here do not account for what the cost may be with help from insurance (it may be cheaper with your insurance), but the website can give you the price if you did not use any insurance.  - You can print the associated coupon and take it with your prescription to the pharmacy.  - You may also stop by our office during regular business hours and pick up a GoodRx coupon card.  - If you need your prescription sent electronically to a different pharmacy, notify our office through [facility] or by phone at [phone] option 4.     Si Usted Necesita Algo Despus de Su Visita  Tambin puede enviarnos un mensaje a travs de Pharmacist, community. Por lo general respondemos a los mensajes de MyChart en el transcurso de 1 a 2 das hbiles.  Para renovar recetas, por favor pida a su farmacia que se ponga en contacto con nuestra oficina. Harland Dingwall de fax es [place] [phone].  Si tiene un asunto urgente cuando la clnica est cerrada y que no puede  esperar hasta el siguiente da hbil, puede llamar/localizar a su doctor(a) al nmero que aparece a continuacin.   Por favor, tenga en cuenta que aunque hacemos todo lo posible para estar disponibles para asuntos urgentes fuera del horario de [place], no estamos disponibles las 24 horas del da, los 7 das de la [place].   Si tiene un problema urgente y no puede comunicarse con nosotros, puede optar por buscar atencin mdica  en el consultorio de su doctor(a), en una clnica privada, en un centro de atencin urgente o en una sala de emergencias.  Si tiene Engineering geologist, por favor llame inmediatamente al 911 o vaya a la sala de emergencias.  Nmeros de bper  - Dr. Nehemiah Massed: [phone]  - Dra. Moye: [phone]  - Dra. Nicole Kindred: [phone]  En caso de inclemencias del [place], por favor llame a Johnsie Kindred principal al [phone] para una actualizacin sobre el [place] de cualquier retraso o cierre.  Consejos para la medicacin en dermatologa: Por favor, guarde las cajas en las que vienen los medicamentos de uso tpico para ayudarle a seguir las instrucciones sobre dnde y cmo usarlos. Las farmacias generalmente imprimen las instrucciones del medicamento slo en las cajas y no directamente en los tubos del [place].   Si su medicamento es muy caro, por favor, pngase en contacto con Zigmund Daniel llamando al [phone] y presione la opcin 4 o envenos un mensaje a travs de Pharmacist, community.   No podemos decirle cul ser su copago por los medicamentos por adelantado ya que esto es diferente dependiendo de la cobertura de su seguro. Sin embargo, es posible que podamos encontrar un medicamento sustituto a Electrical engineer un formulario para que el seguro cubra el medicamento que se considera necesario.   Si se requiere una autorizacin previa para que su compaa de seguros [place] su medicamento, por favor permtanos de 1 a 2 das hbiles para completar este proceso.  Los  precios de los medicamentos varan con frecuencia dependiendo del Environmental consultant de dnde se surte la receta y alguna farmacias pueden ofrecer precios ms baratos.  El sitio web www.goodrx.com tiene cupones para medicamentos de Airline pilot. Los precios aqu no tienen en cuenta lo que podra costar con la ayuda del seguro (puede ser ms barato con su seguro), pero el sitio web puede darle el  el precio si no utiliz ningn seguro.  - Puede imprimir el cupn correspondiente y llevarlo con su receta a la farmacia.  - Tambin puede pasar por nuestra oficina durante el horario de atencin regular y recoger una tarjeta de cupones de GoodRx.  - Si necesita que su receta se enve electrnicamente a una farmacia diferente, informe a nuestra oficina a travs de MyChart de [place] o por telfono llamando al [phone] y presione la opcin 4.  

## 2022-07-10 NOTE — Progress Notes (Signed)
   Follow-Up Visit   Subjective  Maria Cohen is a 37 y.o. female who presents for the following: Annual Exam (No personal hx of skin cancer or dysplastic nevi).  The patient presents for Total-Body Skin Exam (TBSE) for skin cancer screening and mole check.  The patient has spots, moles and lesions to be evaluated, some may be new or changing and the patient has concerns that these could be cancer.   The following portions of the chart were reviewed this encounter and updated as appropriate:  Tobacco  Allergies  Meds  Problems  Med Hx  Surg Hx  Fam Hx      Review of Systems: No other skin or systemic complaints except as noted in HPI or Assessment and Plan.   Objective  Well appearing patient in no apparent distress; mood and affect are within normal limits.  A full examination was performed including scalp, head, eyes, ears, nose, lips, neck, chest, axillae, abdomen, back, buttocks, bilateral upper extremities, bilateral lower extremities, hands, feet, fingers, toes, fingernails, and toenails. All findings within normal limits unless otherwise noted below.   Assessment & Plan   Lentigines - Scattered tan macules - Due to sun exposure - Benign-appearing, observe - Recommend daily broad spectrum sunscreen SPF 30+ to sun-exposed areas, reapply every 2 hours as needed. - Call for any changes  Seborrheic Keratoses - Stuck-on, waxy, tan-brown papules and/or plaques  - Benign-appearing - Discussed benign etiology and prognosis. - Observe - Call for any changes  Melanocytic Nevi - Tan-brown and/or pink-flesh-colored symmetric macules and papules - Benign appearing on exam today - Observation - Call clinic for new or changing moles - Recommend daily use of broad spectrum spf 30+ sunscreen to sun-exposed areas.  - Check nails when remove polish.   Hemangiomas - Red papules - Discussed benign nature - Observe - Call for any changes  Actinic Damage - Chronic condition,  secondary to cumulative UV/sun exposure - diffuse scaly erythematous macules with underlying dyspigmentation - Recommend daily broad spectrum sunscreen SPF 30+ to sun-exposed areas, reapply every 2 hours as needed.  - Staying in the shade or wearing long sleeves, sun glasses (UVA+UVB protection) and wide brim hats (4-inch brim around the entire circumference of the hat) are also recommended for sun protection.  - Call for new or changing lesions.  Skin cancer screening performed today.  Nevus Spilus. Left upper back. Owens Shark macules or papules within lighter tan patch - Genetic - Benign, observe - Call for any changes   Xerosis. Hands. - diffuse xerotic patches - recommend gentle, hydrating skin care - gentle skin care handout given -Patient deferred treatment at this time.   I, Emelia Salisbury, CMA, am acting as scribe for Forest Gleason, MD.  Return in about 1 year (around 07/11/2023) for TBSE.  I, Emelia Salisbury, CMA, am acting as scribe for Forest Gleason, MD.  Documentation: I have reviewed the above documentation for accuracy and completeness, and I agree with the above.  Forest Gleason, MD

## 2022-07-18 ENCOUNTER — Encounter: Payer: Self-pay | Admitting: Dermatology

## 2022-07-19 DIAGNOSIS — N979 Female infertility, unspecified: Secondary | ICD-10-CM | POA: Diagnosis not present

## 2022-07-22 DIAGNOSIS — H5213 Myopia, bilateral: Secondary | ICD-10-CM | POA: Diagnosis not present

## 2022-07-29 ENCOUNTER — Other Ambulatory Visit: Payer: Self-pay

## 2022-09-23 ENCOUNTER — Encounter: Payer: Self-pay | Admitting: Internal Medicine

## 2022-09-23 ENCOUNTER — Other Ambulatory Visit: Payer: Self-pay

## 2022-09-23 ENCOUNTER — Ambulatory Visit (INDEPENDENT_AMBULATORY_CARE_PROVIDER_SITE_OTHER): Payer: 59 | Admitting: Internal Medicine

## 2022-09-23 VITALS — BP 112/74 | HR 68 | Temp 96.8°F | Ht 66.0 in | Wt 146.0 lb

## 2022-09-23 DIAGNOSIS — Z0000 Encounter for general adult medical examination without abnormal findings: Secondary | ICD-10-CM

## 2022-09-23 DIAGNOSIS — Z23 Encounter for immunization: Secondary | ICD-10-CM

## 2022-09-23 MED ORDER — AMPHETAMINE-DEXTROAMPHETAMINE 10 MG PO TABS
10.0000 mg | ORAL_TABLET | Freq: Two times a day (BID) | ORAL | 0 refills | Status: DC
  Filled 2022-09-23: qty 60, 30d supply, fill #0

## 2022-09-23 NOTE — Progress Notes (Signed)
Subjective:    Patient ID: Maria Cohen, female    DOB: 06-17-1986, 37 y.o.   MRN: [id number]  HPI  Pt presents to the clinic today for her annual exam.  Flu: 04/2022 Tetanus: > 10 years ago Covid: Pfizer x 2 Pap smear: 01/2021 Dentist: biannually  Diet: She does eats meat. She consumes veggies but no fruits. She drink mostly coffee, water, soda. Exercise: 3 xweek  Review of Systems     Past Medical History:  Diagnosis Date   Chronic constipation    Raynaud's disease     Current Outpatient Medications  Medication Sig Dispense Refill   letrozole (FEMARA) 2.5 MG tablet Take 2.5 mg by mouth daily.     No current facility-administered medications for this visit.    No Known Allergies  Family History  Problem Relation Age of Onset   Osteopenia Mother    COPD Father    Lung cancer Father    Breast cancer Maternal Grandmother 18   Kidney disease Maternal Grandmother     Social History   Socioeconomic History   Marital status: Married    Spouse name: Not on file   Number of children: Not on file   Years of education: Not on file   Highest education level: Not on file  Occupational History   Not on file  Tobacco Use   Smoking status: Never   Smokeless tobacco: Never  Vaping Use   Vaping Use: Never used  Substance and Sexual Activity   Alcohol use: Yes    Comment: ocasionally   Drug use: No   Sexual activity: Yes    Birth control/protection: None  Other Topics Concern   Not on file  Social History Narrative   Not on file   Social Determinants of Health   Financial Resource Strain: Not on file  Food Insecurity: Not on file  Transportation Needs: Not on file  Physical Activity: Not on file  Stress: Not on file  Social Connections: Not on file  Intimate Partner Violence: Not on file     Constitutional: Denies fever, malaise, fatigue, headache or abrupt weight changes.  HEENT: Denies eye pain, eye redness, ear pain, ringing in the ears, wax  buildup, runny nose, nasal congestion, bloody nose, or sore throat. Respiratory: Denies difficulty breathing, shortness of breath, cough or sputum production.   Cardiovascular: Denies chest pain, chest tightness, palpitations or swelling in the hands or feet.  Gastrointestinal: Pt reports chronic constipation. Denies abdominal pain, bloating, diarrhea or blood in the stool.  GU: Denies urgency, frequency, pain with urination, burning sensation, blood in urine, odor or discharge. Musculoskeletal: Denies decrease in range of motion, difficulty with gait, muscle pain or joint pain and swelling.  Skin: Denies redness, rashes, lesions or ulcercations.  Neurological: Denies dizziness, difficulty with memory, difficulty with speech or problems with balance and coordination.  Psych: Denies anxiety, depression, SI/HI.  No other specific complaints in a complete review of systems (except as listed in HPI above).  Objective:   Physical Exam BP 112/74 (BP Location: Left Arm, Patient Position: Sitting, Cuff Size: Normal)   Pulse 68   Temp (!) 96.8 F (36 C) (Temporal)   Ht 5\' 6"  (1.676 m)   Wt 146 lb (66.2 kg)   BMI 23.57 kg/m   Wt Readings from Last 3 Encounters:  07/30/21 143 lb (64.9 kg)  06/25/21 143 lb (64.9 kg)  01/29/21 143 lb (64.9 kg)    General: Appears her stated age, well developed,  well nourished in NAD. Skin: Warm, dry and intact.  HEENT: Head: normal shape and size; Eyes: sclera white, no icterus, conjunctiva pink, PERRLA and EOMs intact;  Cardiovascular: Normal rate and rhythm. S1,S2 noted.  No murmur, rubs or gallops noted. No JVD or BLE edema. N Pulmonary/Chest: Normal effort and positive vesicular breath sounds. No respiratory distress. No wheezes, rales or ronchi noted.  Abdomen: Normal bowel sounds.  Musculoskeletal: No difficulty with gait.  Neurological: Alert and oriented. Cranial nerves II-XII grossly intact. Coordination normal.  Psychiatric: Mood and affect normal.  Behavior is normal. Judgment and thought content normal.    BMET    Component Value Date/Time   NA 137 04/01/2019 1050   NA 141 01/21/2019 1527   K 5.0 04/01/2019 1050   CL 102 04/01/2019 1050   CO2 25 04/01/2019 1050   GLUCOSE 100 (H) 04/01/2019 1050   BUN 16 04/01/2019 1050   BUN 10 01/21/2019 1527   CREATININE 0.92 04/01/2019 1050   CALCIUM 9.9 04/01/2019 1050   GFRNONAA 81 04/01/2019 1050   GFRAA 93 04/01/2019 1050    Lipid Panel     Component Value Date/Time   CHOL 192 04/01/2019 1050   CHOL 206 (H) 01/21/2019 1527   TRIG 64 04/01/2019 1050   HDL 86 04/01/2019 1050   HDL 88 01/21/2019 1527   CHOLHDL 2.2 04/01/2019 1050   LDLCALC 91 04/01/2019 1050    CBC    Component Value Date/Time   WBC 7.1 04/01/2019 1050   RBC 3.93 04/01/2019 1050   HGB 12.8 04/01/2019 1050   HCT 37.3 04/01/2019 1050   PLT 365 04/01/2019 1050   MCV 94.9 04/01/2019 1050   MCH 32.6 04/01/2019 1050   MCHC 34.3 04/01/2019 1050   RDW 12.6 04/01/2019 1050   LYMPHSABS 2,329 04/01/2019 1050   EOSABS 50 04/01/2019 1050   BASOSABS 99 04/01/2019 1050    Hgb A1C No results found for: "HGBA1C"          Assessment & Plan:   Preventative Health Maintenance:  Encouraged her to get a flu shot in the fall Tetanus today Encouraged her to get her covid booster Pap smear UTD Encouraged her to consume a balanced diet and exercise regimen Advised her to see an eye doctor and dentist annually  RTC in 1 year, sooner if needed Webb Silversmith, NP

## 2022-09-23 NOTE — Patient Instructions (Signed)

## 2022-10-11 DIAGNOSIS — Z8742 Personal history of other diseases of the female genital tract: Secondary | ICD-10-CM | POA: Diagnosis not present

## 2022-10-16 ENCOUNTER — Other Ambulatory Visit: Payer: Self-pay | Admitting: Internal Medicine

## 2022-10-16 DIAGNOSIS — N926 Irregular menstruation, unspecified: Secondary | ICD-10-CM

## 2022-10-18 DIAGNOSIS — O209 Hemorrhage in early pregnancy, unspecified: Secondary | ICD-10-CM | POA: Diagnosis not present

## 2022-10-21 ENCOUNTER — Other Ambulatory Visit: Payer: 59

## 2022-10-21 DIAGNOSIS — O209 Hemorrhage in early pregnancy, unspecified: Secondary | ICD-10-CM | POA: Diagnosis not present

## 2022-11-06 DIAGNOSIS — O039 Complete or unspecified spontaneous abortion without complication: Secondary | ICD-10-CM | POA: Diagnosis not present

## 2022-11-11 DIAGNOSIS — N96 Recurrent pregnancy loss: Secondary | ICD-10-CM | POA: Diagnosis not present

## 2022-11-11 DIAGNOSIS — Z3169 Encounter for other general counseling and advice on procreation: Secondary | ICD-10-CM | POA: Diagnosis not present

## 2022-11-18 ENCOUNTER — Other Ambulatory Visit: Payer: Self-pay

## 2022-11-18 ENCOUNTER — Other Ambulatory Visit: Payer: Self-pay | Admitting: Internal Medicine

## 2022-11-18 MED ORDER — AMPHETAMINE-DEXTROAMPHETAMINE 15 MG PO TABS
15.0000 mg | ORAL_TABLET | Freq: Two times a day (BID) | ORAL | 0 refills | Status: DC
  Filled 2022-11-18: qty 60, 30d supply, fill #0

## 2022-12-23 ENCOUNTER — Other Ambulatory Visit: Payer: Self-pay

## 2022-12-23 ENCOUNTER — Ambulatory Visit (INDEPENDENT_AMBULATORY_CARE_PROVIDER_SITE_OTHER): Payer: 59 | Admitting: Internal Medicine

## 2022-12-23 ENCOUNTER — Encounter: Payer: Self-pay | Admitting: Internal Medicine

## 2022-12-23 VITALS — BP 110/68 | HR 82 | Temp 97.1°F | Resp 18 | Wt 146.0 lb

## 2022-12-23 DIAGNOSIS — Z319 Encounter for procreative management, unspecified: Secondary | ICD-10-CM | POA: Diagnosis not present

## 2022-12-23 DIAGNOSIS — G5702 Lesion of sciatic nerve, left lower limb: Secondary | ICD-10-CM | POA: Diagnosis not present

## 2022-12-23 MED ORDER — CYCLOBENZAPRINE HCL 5 MG PO TABS
5.0000 mg | ORAL_TABLET | Freq: Three times a day (TID) | ORAL | 0 refills | Status: DC | PRN
  Filled 2022-12-23: qty 30, 10d supply, fill #0

## 2022-12-23 NOTE — Patient Instructions (Signed)
Piriformis Syndrome  Piriformis syndrome is a condition that can cause pain and numbness in your buttocks and down the back of your leg. Piriformis syndrome happens when the small muscle that connects the base of your spine to your hip (piriformis muscle) presses on the nerve that runs down the back of your leg (sciatic nerve). The piriformis muscle helps your hip rotate and helps to bring your leg back and out. It also helps shift your weight to keep you stable while you are walking. The sciatic nerve runs under or through the piriformis muscle. Damage to the piriformis muscle can cause spasms that put pressure on the nerve below. This causes pain and discomfort while sitting and moving. The pain may feel as if it begins in the buttock and spreads (radiates) down your hip and thigh. What are the causes? This condition is caused by pressure on the sciatic nerve from the piriformis muscle. The piriformis muscle can get irritated with overuse, especially if other hip muscles are weak and the piriformis muscle has to do extra work. Piriformis syndrome can also occur after an injury, like a fall onto your buttocks. What increases the risk? You are more likely to develop this condition if you: Are a woman. Sit for long periods of time. Are a cyclist. Have weak buttocks muscles (gluteal muscles). What are the signs or symptoms? Symptoms of this condition include: Pain, tingling, or numbness that starts in the buttock and runs down the back of your leg (sciatica). Pain in the groin or thigh area. Your symptoms may get worse: The longer you sit. When you walk, run, or climb stairs. When straining to have a bowel movement. How is this diagnosed? This condition is diagnosed based on your symptoms, medical history, and physical exam. During the exam, your health care provider may: Move your leg into different positions to check for pain. Press on the muscles of your hip and buttock to see if that  increases your symptoms. You may also have tests, including: Imaging tests such as X-rays, CT, MRI, or ultrasound. Electromyogram (EMG). This test measures electrical signals sent by your nerves into the muscles. Nerve conduction study. This test measures how well electrical signals pass through your nerves. How is this treated? This condition may be treated by: Stopping all activities that cause pain or make your condition worse. Applying ice or using heat therapy. Taking medicines to reduce pain and swelling. Taking a muscle relaxer (muscle relaxant) to stop muscle spasms. Doing range-of-motion and strengthening exercises (physical therapy) as told by your health care provider. Having massage, acupuncture, or local electrical stimulation (transcutaneous electrical nerve stimulation, TENS). Getting an injection of medicine in the piriformis muscle. Your health care provider will choose the medicine based on your condition. He or she may inject: An anti-inflammatory medicine (steroid) to reduce swelling. A numbing medicine (local anesthetic) to block the pain. Botulinum toxin. The toxin blocks nerve impulses to specific muscles to reduce muscle tension. In rare cases, you may need surgery to cut the muscle and release pressure on the nerve if other treatments do not work. Follow these instructions at home: Activity Do not sit for long periods. Get up and walk around every 20 minutes or as often as told by your health care provider. When driving long distances, make sure to take frequent stops to get up and stretch. Use a cushion when you sit on hard surfaces. Do exercises as told by your health care provider. Return to your normal activities as   told by your health care provider. Ask your health care provider what activities are safe for you. Managing pain, stiffness, and swelling     If directed, apply heat to the area as often as told by your health care provider. Use the heat source  that your health care provider recommends, such as a moist heat pack or a heating pad. Place a towel between your skin and the heat source. Leave the heat on for 20-30 minutes. Remove the heat if your skin turns bright red. This is especially important if you are unable to feel pain, heat, or cold. You have a greater risk of getting burned. If directed, put ice on the injured area. To do this: Put ice in a plastic bag. Place a towel between your skin and the bag. Leave the ice on for 20 minutes, 2-3 times a day. Remove the ice if your skin turns bright red. This is very important. If you cannot feel pain, heat, or cold, you have a greater risk of damage to the area. General instructions Take over-the-counter and prescription medicines only as told by your health care provider. Ask your health care provider if the medicine prescribed to you requires you to avoid driving or using machinery. You may need to take these actions to prevent or treat constipation: Drink enough fluid to keep your urine pale yellow. Take over-the-counter or prescription medicines. Eat foods that are high in fiber, such as beans, whole grains, and fresh fruits and vegetables. Limit foods that are high in fat and processed sugars, such as fried or sweet foods. Keep all follow-up visits. This is important. How is this prevented? Do not sit for longer than 20 minutes at a time. When you sit, choose padded surfaces. Warm up and stretch before being active. Cool down and stretch after being active. Contact a health care provider if: Your pain and stiffness continue or get worse. Your leg or hip becomes weak. You have changes in your bowel function or bladder function. Summary Piriformis syndrome is a condition that can cause pain, tingling, and numbness in your buttocks and down the back of your leg. You may try applying heat or ice to relieve the pain. Do not sit for long periods. Get up and walk around every 20  minutes or as often as told by your health care provider. This information is not intended to replace advice given to you by your health care provider. Make sure you discuss any questions you have with your health care provider. Document Revised: 12/18/2020 Document Reviewed: 12/18/2020 Elsevier Patient Education  2024 [employer].

## 2022-12-23 NOTE — Progress Notes (Signed)
Subjective:    Patient ID: Maria Cohen, female    DOB: 06/19/1986, 37 y.o.   MRN: [id number]  HPI  Patient presents to clinic today with complaint of pain in her left buttock. She reports this started 2 years ago. She describes the pain as sharp and stabbing. The pain has started to radiate up into her left lower back but she denies overt back pain. The pain is worse with prolonged sitting and standing. Moving around and stretching makes the pain better. She denies numbness, tingling or weakness of her left lower extremity. She has gotten a massage but has not taken any medications OTC for this.  Review of Systems     Past Medical History:  Diagnosis Date   Chronic constipation    Raynaud's disease     Current Outpatient Medications  Medication Sig Dispense Refill   amphetamine-dextroamphetamine (ADDERALL) 15 MG tablet Take 1 tablet by mouth 2 (two) times daily. 60 tablet 0   No current facility-administered medications for this visit.    No Known Allergies  Family History  Problem Relation Age of Onset   Osteopenia Mother    COPD Father    Lung cancer Father    Breast cancer Maternal Grandmother 75   Kidney disease Maternal Grandmother     Social History   Socioeconomic History   Marital status: Married    Spouse name: Not on file   Number of children: Not on file   Years of education: Not on file   Highest education level: Not on file  Occupational History   Not on file  Tobacco Use   Smoking status: Never   Smokeless tobacco: Never  Vaping Use   Vaping Use: Never used  Substance and Sexual Activity   Alcohol use: Yes    Comment: ocasionally   Drug use: No   Sexual activity: Yes    Birth control/protection: None  Other Topics Concern   Not on file  Social History Narrative   Not on file   Social Determinants of Health   Financial Resource Strain: Not on file  Food Insecurity: Not on file  Transportation Needs: Not on file  Physical Activity:  Not on file  Stress: Not on file  Social Connections: Not on file  Intimate Partner Violence: Not on file     Constitutional: Denies fever, malaise, fatigue, headache or abrupt weight changes.  Respiratory: Denies difficulty breathing, shortness of breath, cough or sputum production.   Cardiovascular: Denies chest pain, chest tightness, palpitations or swelling in the hands or feet.  Musculoskeletal: Pt reports pain in left buttock. Denies decrease in range of motion, difficulty with gait, or joint pain and swelling.  Skin: Denies redness, rashes, lesions or ulcercations.  Neurological: Denies numbness, tingling, weakness or problems with balance and coordination.  Psych: Denies anxiety, depression, SI/HI.  No other specific complaints in a complete review of systems (except as listed in HPI above).  Objective:   Physical Exam   BP 110/68   Pulse 82   Temp (!) 97.1 F (36.2 C)   Resp 18   Wt 146 lb (66.2 kg)   SpO2 100%   BMI 23.57 kg/m   Wt Readings from Last 3 Encounters:  09/23/22 146 lb (66.2 kg)  07/30/21 143 lb (64.9 kg)  06/25/21 143 lb (64.9 kg)    General: Appears her stated age, well developed, well nourished in NAD. Cardiovascular: Normal rate and rhythm.  Pulmonary/Chest: Normal effort and positive vesicular breath sounds.  No respiratory distress. No wheezes, rales or ronchi noted.  Musculoskeletal: Normal flexion, extension and rotation of the spine. Pain with palpation over the piriformis. No difficulty with gait.  Neurological: Alert and oriented.    BMET    Component Value Date/Time   NA 137 04/01/2019 1050   NA 141 01/21/2019 1527   K 5.0 04/01/2019 1050   CL 102 04/01/2019 1050   CO2 25 04/01/2019 1050   GLUCOSE 100 (H) 04/01/2019 1050   BUN 16 04/01/2019 1050   BUN 10 01/21/2019 1527   CREATININE 0.92 04/01/2019 1050   CALCIUM 9.9 04/01/2019 1050   GFRNONAA 81 04/01/2019 1050   GFRAA 93 04/01/2019 1050    Lipid Panel     Component  Value Date/Time   CHOL 192 04/01/2019 1050   CHOL 206 (H) 01/21/2019 1527   TRIG 64 04/01/2019 1050   HDL 86 04/01/2019 1050   HDL 88 01/21/2019 1527   CHOLHDL 2.2 04/01/2019 1050   LDLCALC 91 04/01/2019 1050    CBC    Component Value Date/Time   WBC 7.1 04/01/2019 1050   RBC 3.93 04/01/2019 1050   HGB 12.8 04/01/2019 1050   HCT 37.3 04/01/2019 1050   PLT 365 04/01/2019 1050   MCV 94.9 04/01/2019 1050   MCH 32.6 04/01/2019 1050   MCHC 34.3 04/01/2019 1050   RDW 12.6 04/01/2019 1050   LYMPHSABS 2,329 04/01/2019 1050   EOSABS 50 04/01/2019 1050   BASOSABS 99 04/01/2019 1050    Hgb A1C No results found for: "HGBA1C"         Assessment & Plan:   Left Piriformis Syndrome:  Encouraged continued stretching Referral to PT for further evaluation RX for cyclobenzaprine 5 mg daily prn-- sedation caution given  RTC 9 months for annual exam Nicki Reaper, NP

## 2023-01-13 ENCOUNTER — Other Ambulatory Visit: Payer: Self-pay

## 2023-01-13 ENCOUNTER — Other Ambulatory Visit: Payer: Self-pay | Admitting: Internal Medicine

## 2023-01-13 MED ORDER — AMPHETAMINE-DEXTROAMPHETAMINE 15 MG PO TABS
15.0000 mg | ORAL_TABLET | Freq: Two times a day (BID) | ORAL | 0 refills | Status: DC
  Filled 2023-01-13: qty 60, 30d supply, fill #0

## 2023-01-20 ENCOUNTER — Ambulatory Visit: Payer: 59

## 2023-02-17 ENCOUNTER — Other Ambulatory Visit: Payer: Self-pay

## 2023-02-17 ENCOUNTER — Ambulatory Visit: Payer: 59 | Attending: Internal Medicine

## 2023-02-17 DIAGNOSIS — M25552 Pain in left hip: Secondary | ICD-10-CM | POA: Insufficient documentation

## 2023-02-17 DIAGNOSIS — G5702 Lesion of sciatic nerve, left lower limb: Secondary | ICD-10-CM | POA: Insufficient documentation

## 2023-02-17 DIAGNOSIS — M6281 Muscle weakness (generalized): Secondary | ICD-10-CM | POA: Diagnosis not present

## 2023-02-17 NOTE — Progress Notes (Incomplete)
OUTPATIENT PHYSICAL THERAPY THORACOLUMBAR EVALUATION   Patient Name: Maria Cohen MRN: [id number] DOB:09-Nov-1985, 37 y.o., female Today's Date: 02/18/2023  END OF SESSION:  PT End of Session - 02/17/23 1639     Visit Number 1    Number of Visits 7    Date for PT Re-Evaluation 03/31/23    PT Start Time 1600    PT Stop Time 1643    PT Time Calculation (min) 43 min    Activity Tolerance Patient tolerated treatment well    Behavior During Therapy [facility] for tasks assessed/performed             Past Medical History:  Diagnosis Date   Chronic constipation    Raynaud's disease    Past Surgical History:  Procedure Laterality Date   FINGER SURGERY     Rheumatoid nodule   WISDOM TOOTH EXTRACTION     Patient Active Problem List   Diagnosis Date Noted   Chronic constipation 11/14/2020   Raynaud's disease 11/14/2020    PCP: Lorre Munroe, NP  REFERRING PROVIDER: Lorre Munroe, NP  REFERRING DIAG: G57.02 (ICD-10-CM) - Piriformis syndrome of left side  Rationale for Evaluation and Treatment: Rehabilitation  THERAPY DIAG:  Pain in left hip  Muscle weakness (generalized)  ONSET DATE: 2.5 years  SUBJECTIVE:                                                                                                                                                                                           SUBJECTIVE STATEMENT:  Pt states that she has had increased LLE pain over the past 2.5 years. 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 Pt pinpoints pain to being in the L piriformis muscle. She has attempted to manage this pain by stretching at home, however has not found long term relief from that.   PERTINENT HISTORY:  Pt presents to PT with chronic L sided piriformis pain syndrome. 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 This  positioning includes excess adduction and "tree pose" resulting in increased pain 3-8/10 stemming from the L piriformis muscle. Pt reports no N/T down LE and can pinpoint her pain at the L piriformis. Pt's aggravating factors include standing and sitting for prolonged periods of time and her pain has only been eased somewhat by performing a prolonged piriformis stretch and with walking >30 minutes.   PAIN:  Are you having pain? Yes: NPRS scale: 3/10 Pain location: L piriformis muscle Pain description: Dull/ Achy Aggravating  factors: Prolonged sitting or standing >15 minutes, after a long work week. Relieving factors: Walking, pt states she begins to feel relief after 30 min of walking.  Max pain of 8/10 after a long week of work.  PRECAUTIONS: None  RED FLAGS: Bowel or bladder incontinence: No, Spinal tumors: No, and Cauda equina syndrome: No   WEIGHT BEARING RESTRICTIONS: No  FALLS:  Has patient fallen in last 6 months? No   OCCUPATION: Pt has been a Publishing rights manager for 8 years.   PLOF: Independent  PATIENT GOALS: To relieve the pain.   NEXT MD VISIT: N/A  OBJECTIVE:   DIAGNOSTIC FINDINGS:  N/A   PATIENT SURVEYS:  FOTO 81/88  SCREENING FOR RED FLAGS: Bowel or bladder incontinence: No Spinal tumors: No Cauda equina syndrome: No Compression fracture: No Abdominal aneurysm: No  COGNITION: Overall cognitive status: Within functional limits for tasks assessed     SENSATION: WFL  MUSCLE LENGTH: Hamstrings: WFL bilat   SI Joint Cluster Testing ([place])  Thigh Thrust: Negative Sacral Thrust: Negative Distraction: Negative Gaenslen's: Negative   POSTURE: No Significant postural limitations  PALPATION: TTP at L piriformis. Significant difference in tenderness in comparison to R side.   LUMBAR ROM: All AROM w/ OP pain free.   AROM eval  Flexion WNL  Extension WNL  Right lateral flexion WNL  Left lateral flexion WNL  Right rotation WNL  Left rotation  WNL   (Blank rows = not tested)  LOWER EXTREMITY ROM: All bilat LE AROM WNL   LOWER EXTREMITY MMT:    MMT Right eval Left eval  Hip flexion 5 4+  Hip extension 4+ 4  Hip abduction 4+ 4  Hip adduction 4 4  Hip internal rotation 5 5  Hip external rotation 4+ 3+*  Knee flexion 4+ 4  Knee extension 5 5  Ankle dorsiflexion 5 5  Ankle plantarflexion 5 5  Ankle inversion    Ankle eversion     (Blank rows = not tested)  LUMBAR SPECIAL TESTS:  SI Compression/distraction test: Negative and FABER test: Negative Piriformis stretch test: positive, concordant sx FADDIR: Negative bilat  FUNCTIONAL TESTS:  Single leg squat from stair: No concordant sx noted x8 bilat.   TODAY'S TREATMENT:                                                                                                                              DATE: 02/16/22   There.ex: LLE Supine Figure 4 Piriformis Stretch with Leg Extension  3 x30 sec hold  Supine Bridge with Glute Set 2 x10; 3 sec hold  Sidelying Clamshell RLE/ LLE with RTB at bilat knees 2 x12/ each side Sidelying Reverse Clamshell RLE/LLE with RTB at bilat ankles 2 x12/ each side  PATIENT EDUCATION:  Education details: HEP provided Person educated: Patient Education method: Medical illustrator Education comprehension: verbalized understanding and returned demonstration  HOME EXERCISE PROGRAM: Access Code: [email or web address] URL: [email or web address] Date: 02/18/2023 Prepared by:  Milton Fairly  Exercises - Supine Figure 4 Piriformis Stretch with Leg Extension  - 1 x daily - 7 x weekly - 3 sets - 30 hold - Supine Bridge with Gluteal Set and Spinal Articulation  - 1 x daily - 7 x weekly - 2 sets - 10 reps - Clamshell with Resistance  - 1 x daily - 7 x weekly - 2 sets - 12 reps - Sidelying Reverse Clamshell  - 1 x daily - 7 x weekly - 2 sets - 12 reps  ASSESSMENT:  CLINICAL IMPRESSION: Patient is a 38 y.o. F who was seen today for  physical therapy evaluation and treatment for chronic L sided piriformis pain. Upon examination, pt presents with TTP at the L piriformis with no sx of radiating pain down LE's. Pt's SIJ dysfunction testing appears to be negative along with no hip provocation test causing concordant pain. Pt displays increased tissue extensibility displayed with lumbar and bilat LE ROM, noting no significant muscle length discrepancies. Overall pt's LE strength are grossly WNL, except for L hip external rotation noting moderate weakness and concordant pain with this movement. Pt's pain is limiting her from completing standing tasks at work, including standing to chart patient's notes, along with impedes her ability to drive >16 minutes before having to take a stretch break to reduce LLE pain. Pt would benefit from skilled PT interventions to address pain and strength deficits to improve QoL and return to PLOF.  OBJECTIVE IMPAIRMENTS: decreased strength and pain.   ACTIVITY LIMITATIONS: sitting and standing  PARTICIPATION LIMITATIONS: driving and community activity  PERSONAL FACTORS: Age, Education, Fitness, and Time since onset of injury/illness/exacerbation are also affecting patient's functional outcome.   REHAB POTENTIAL: Excellent  CLINICAL DECISION MAKING: Stable/uncomplicated  EVALUATION COMPLEXITY: Low   GOALS: Goals reviewed with patient? Yes  SHORT TERM GOALS: Target date: 03/17/2023  Pt will be independent with HEP to improve LLE pain and strength to improve standing and sitting tasks.  Baseline: 02/17/23: HEP given to pt  Goal status: INITIAL  LONG TERM GOALS: Target date: 03/31/2023  Pt will improve FOTO to target score to demonstrate clinically significant improvement in functional mobility.  Baseline: 02/17/23: 81/88 Goal status: INITIAL  2.  Pt will self report ability to stand > 30 min with less than 6/10 NPS to demonstrate clinically significant improvement in pain with work tasks and  standing ADL's at home.  Baseline: 02/17/23: 8/10 Goal status: INITIAL  3.  Pt will self report the ability to drive >10 min with less than 6/10 NPS to demonstrate clinically significant improvement in pain with community ADL's that involve traveling longer distances.  Baseline: 02/17/23: 8/10 Goal status: INITIAL    4.  Pt will improve LLE hip ER MMT to be pain-free and 5/5 to demonstrate improved hip stability and muscle imbalance with standing/walking tasks.  Baseline: 02/17/23: 3+/5, concordant hip pain Goal status: INITIAL   PLAN:  PT FREQUENCY: 1x/week  PT DURATION: 6 weeks  PLANNED INTERVENTIONS: Therapeutic exercises, Therapeutic activity, Neuromuscular re-education, Balance training, Gait training, Patient/Family education, Self Care, Joint mobilization, Joint manipulation, Spinal manipulation, Spinal mobilization, and Manual therapy.  PLAN FOR NEXT SESSION: Review HEP, manual therapy for piriformis relief, hip ER strengthening exercises.   Kristine Royal, SPT  Delphia Grates. Fairly IV, PT, DPT Physical Therapist- [place]  [place]  02/18/2023, 1:53 PM

## 2023-02-19 ENCOUNTER — Ambulatory Visit: Payer: 59

## 2023-02-20 ENCOUNTER — Encounter: Payer: Self-pay | Admitting: Internal Medicine

## 2023-02-20 DIAGNOSIS — H9313 Tinnitus, bilateral: Secondary | ICD-10-CM

## 2023-02-21 DIAGNOSIS — E559 Vitamin D deficiency, unspecified: Secondary | ICD-10-CM | POA: Diagnosis not present

## 2023-02-24 ENCOUNTER — Ambulatory Visit: Payer: 59

## 2023-02-24 DIAGNOSIS — M25552 Pain in left hip: Secondary | ICD-10-CM

## 2023-02-24 DIAGNOSIS — N96 Recurrent pregnancy loss: Secondary | ICD-10-CM | POA: Diagnosis not present

## 2023-02-24 DIAGNOSIS — M6281 Muscle weakness (generalized): Secondary | ICD-10-CM

## 2023-02-24 DIAGNOSIS — G5702 Lesion of sciatic nerve, left lower limb: Secondary | ICD-10-CM | POA: Diagnosis not present

## 2023-02-24 NOTE — Therapy (Signed)
OUTPATIENT PHYSICAL THERAPY THORACOLUMBAR TREATMENT     Patient Name: Maria Cohen MRN: [id number] DOB:1986/04/25, 37 y.o., female Today's Date: 02/18/2023   END OF SESSION:   PT End of Session - 02/17/23 1639       Visit Number 2    Number of Visits 7     Date for PT Re-Evaluation 03/31/23     PT Start Time 1600     PT Stop Time 1643     PT Time Calculation (min) 43 min     Activity Tolerance Patient tolerated treatment well     Behavior During Therapy [facility] for tasks assessed/performed                       Past Medical History:  Diagnosis Date   Chronic constipation     Raynaud's disease               Past Surgical History:  Procedure Laterality Date   FINGER SURGERY        Rheumatoid nodule   WISDOM TOOTH EXTRACTION                Patient Active Problem List    Diagnosis Date Noted   Chronic constipation 11/14/2020   Raynaud's disease 11/14/2020      PCP: Lorre Munroe, NP   REFERRING PROVIDER: Lorre Munroe, NP   REFERRING DIAG: G57.02 (ICD-10-CM) - Piriformis syndrome of left side   Rationale for Evaluation and Treatment: Rehabilitation   THERAPY DIAG:  Pain in left hip   Muscle weakness (generalized)   ONSET DATE: 2.5 years   SUBJECTIVE:                                                                                                                                                                                            SUBJECTIVE STATEMENT:            Pt reports 6/10 NPS in her L piriformis likely from a busy weekend of driving. Does report HEP compliance and relief of symptoms.    PERTINENT HISTORY:  Pt presents to PT with chronic L sided piriformis pain syndrome. 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 This positioning includes excess adduction and "tree pose" resulting in increased pain 3-8/10 stemming from the L piriformis muscle. Pt reports no N/T  down LE and can pinpoint her pain at the L piriformis. Pt's aggravating factors include standing and sitting for prolonged periods of time and her pain has only been eased somewhat by performing a prolonged  piriformis stretch and with walking >30 minutes.    PAIN:  Are you having pain? Yes: NPRS scale: 6/10 Pain location: L piriformis muscle Pain description: Dull/ Achy Aggravating factors: Prolonged sitting or standing >15 minutes, after a long work week. Relieving factors: Walking, pt states she begins to feel relief after 30 min of walking.  Max pain of 8/10 after a long week of work.  PRECAUTIONS: None   RED FLAGS: Bowel or bladder incontinence: No, Spinal tumors: No, and Cauda equina syndrome: No            WEIGHT BEARING RESTRICTIONS: No   FALLS:  Has patient fallen in last 6 months? No     OCCUPATION: Pt has been a Publishing rights manager for 8 years.    PLOF: Independent   PATIENT GOALS: To relieve the pain.    NEXT MD VISIT: N/A   OBJECTIVE:    DIAGNOSTIC FINDINGS:  N/A    PATIENT SURVEYS:  FOTO 81/88   SCREENING FOR RED FLAGS: Bowel or bladder incontinence: No Spinal tumors: No Cauda equina syndrome: No Compression fracture: No Abdominal aneurysm: No   COGNITION: Overall cognitive status: Within functional limits for tasks assessed                          SENSATION: WFL   MUSCLE LENGTH: Hamstrings: WFL bilat    SI Joint Cluster Testing ([place])  Thigh Thrust: Negative Sacral Thrust: Negative Distraction: Negative Gaenslen's: Negative     POSTURE: No Significant postural limitations   PALPATION: TTP at L piriformis. Significant difference in tenderness in comparison to R side.    LUMBAR ROM: All AROM w/ OP pain free.    AROM eval  Flexion WNL  Extension WNL  Right lateral flexion WNL  Left lateral flexion WNL  Right rotation WNL  Left rotation WNL   (Blank rows = not tested)   LOWER EXTREMITY ROM: All bilat LE AROM WNL    LOWER  EXTREMITY MMT:     MMT Right eval Left eval  Hip flexion 5 4+  Hip extension 4+ 4  Hip abduction 4+ 4  Hip adduction 4 4  Hip internal rotation 5 5  Hip external rotation 4+ 3+*  Knee flexion 4+ 4  Knee extension 5 5  Ankle dorsiflexion 5 5  Ankle plantarflexion 5 5  Ankle inversion      Ankle eversion       (Blank rows = not tested)   LUMBAR SPECIAL TESTS:  SI Compression/distraction test: Negative and FABER test: Negative Piriformis stretch test: positive, concordant sx FADDIR: Negative bilat   FUNCTIONAL TESTS:  Single leg squat from stair: No concordant sx noted x8 bilat.     TODAY'S TREATMENT:                                                                                                                              DATE: 02/23/22  There.ex: Ambulation on treadmill 5 minutes for LE warm up.  Reassessment of HEP:   Piriformis stretch with LE extension: 3x30 sec/side  Glut bridge: x10, 10 second holds. Added glut bridge with GTB at distal femurs: 2x10  Side lying clam shell+ side plank with GTB: 2x12/side   Side lying reverse clamshell with GTB:2x12/side  Side stepping with GTB: 4x10' D/B   Alt lunges + 5# DB's in UE's: 2x10   PATIENT EDUCATION:  Education details: HEP provided Person educated: Patient Education method: Explanation and Demonstration Education comprehension: verbalized understanding and returned demonstration   HOME EXERCISE PROGRAM: Access Code: [email or web address] URL: [email or web address] Date: 02/24/2023 Prepared by: Ronnie Derby  Exercises - Supine Figure 4 Piriformis Stretch with Leg Extension  - 1 x daily - 7 x weekly - 3 sets - 30 hold - Supine Bridge with Gluteal Set and Spinal Articulation  - 1 x daily - 7 x weekly - 2 sets - 10 reps - Sidelying Reverse Clamshell  - 1 x daily - 3-4 x weekly - 2 sets - 12 reps - Sidelying Forearm Plank with Clam and Punch  - 1 x daily - 3-4 x weekly - 3 sets - 10 reps - Side Stepping with  Resistance at Ankles  - 1 x daily - 3-4 x weekly - 2 sets - 4 reps - Standard Lunge  - 1 x daily - 3-4 x weekly - 2 sets - 10 reps  Access Code: [email or web address] URL: [email or web address] Date: 02/18/2023 Prepared by: Ronnie Derby   Exercises - Supine Figure 4 Piriformis Stretch with Leg Extension  - 1 x daily - 7 x weekly - 3 sets - 30 hold - Supine Bridge with Gluteal Set and Spinal Articulation  - 1 x daily - 7 x weekly - 2 sets - 10 reps - Clamshell with Resistance  - 1 x daily - 7 x weekly - 2 sets - 12 reps - Sidelying Reverse Clamshell  - 1 x daily - 7 x weekly - 2 sets - 12 reps   ASSESSMENT:   CLINICAL IMPRESSION: Pt arriving to PT for first f/u. Pt with good understanding of HEP allowing for quick progression in hip/core stability. Pt required min multimodal cuing initially with new exercises but overall excellent carryover post cues. Pt endorses pain relief from 6/10 NPS to 4/10 NPS post session with HEP updates as noted above. Educated pt on reps/sets/frequency today. Pt continues to report worsening pain with prolonged sitting but demonstrates excellent response to treatment so far. Pt would benefit from skilled PT interventions to address pain and strength deficits to improve QoL and return to PLOF.   OBJECTIVE IMPAIRMENTS: decreased strength and pain.    ACTIVITY LIMITATIONS: sitting and standing   PARTICIPATION LIMITATIONS: driving and community activity   PERSONAL FACTORS: Age, Education, Fitness, and Time since onset of injury/illness/exacerbation are also affecting patient's functional outcome.    REHAB POTENTIAL: Excellent   CLINICAL DECISION MAKING: Stable/uncomplicated   EVALUATION COMPLEXITY: Low     GOALS: Goals reviewed with patient? Yes   SHORT TERM GOALS: Target date: 03/17/2023   Pt will be independent with HEP to improve LLE pain and strength to improve standing and sitting tasks.  Baseline: 02/17/23: HEP given to pt  Goal status: INITIAL    LONG TERM GOALS: Target date: 03/31/2023   Pt will improve FOTO to target score to demonstrate clinically significant improvement in functional mobility.  Baseline: 02/17/23: 81/88 Goal status: INITIAL   2.  Pt will self report  ability to stand > 30 min with less than 6/10 NPS to demonstrate clinically significant improvement in pain with work tasks and standing ADL's at home.  Baseline: 02/17/23: 8/10 Goal status: INITIAL   3.  Pt will self report the ability to drive >16 min with less than 6/10 NPS to demonstrate clinically significant improvement in pain with community ADL's that involve traveling longer distances.  Baseline: 02/17/23: 8/10 Goal status: INITIAL       4.  Pt will improve LLE hip ER MMT to be pain-free and 5/5 to demonstrate improved hip stability and muscle imbalance with standing/walking tasks.  Baseline: 02/17/23: 3+/5, concordant hip pain Goal status: INITIAL     PLAN:   PT FREQUENCY: 1x/week   PT DURATION: 6 weeks   PLANNED INTERVENTIONS: Therapeutic exercises, Therapeutic activity, Neuromuscular re-education, Balance training, Gait training, Patient/Family education, Self Care, Joint mobilization, Joint manipulation, Spinal manipulation, Spinal mobilization, and Manual therapy.   PLAN FOR NEXT SESSION: Review HEP, manual therapy for piriformis relief, hip ER strengthening exercises.    Kristine Royal, SPT   Delphia Grates. Fairly IV, PT, DPT Physical Therapist- [place]  [facility]

## 2023-02-24 NOTE — Therapy (Deleted)
PCP: Lorre Munroe, NP   REFERRING PROVIDER: Lorre Munroe, NP   REFERRING DIAG: G57.02 (ICD-10-CM) - Piriformis syndrome of left side   Rationale for Evaluation and Treatment: Rehabilitation   THERAPY DIAG:  Pain in left hip   Muscle weakness (generalized)   ONSET DATE: 2.5 years   SUBJECTIVE:                                                                                                                                                                                            SUBJECTIVE STATEMENT:            Pt states that she has had increased LLE pain over the past 2.5 years. 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 Pt pinpoints pain to being in the L piriformis muscle. She has attempted to manage this pain by stretching at home, however has not found long term relief from that.    PERTINENT HISTORY:  Pt presents to PT with chronic L sided piriformis pain syndrome. 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 This positioning includes excess adduction and "tree pose" resulting in increased pain 3-8/10 stemming from the L piriformis muscle. Pt reports no N/T down LE and can pinpoint her pain at the L piriformis. Pt's aggravating factors include standing and sitting for prolonged periods of time and her pain has only been eased somewhat by performing a prolonged piriformis stretch and with walking >30 minutes.    PAIN:  Are you having pain? Yes: NPRS scale: 3/10 Pain location: L piriformis muscle Pain description: Dull/ Achy Aggravating factors: Prolonged sitting or standing >15 minutes, after a long work week. Relieving factors: Walking, pt states she begins to feel relief after 30 min of walking.  Max pain of 8/10 after a long week of work.  PRECAUTIONS: None   RED FLAGS: Bowel or bladder incontinence: No, Spinal tumors: No,  and Cauda equina syndrome: No            WEIGHT BEARING RESTRICTIONS: No   FALLS:  Has patient fallen in last 6 months? No     OCCUPATION: Pt has been a Publishing rights manager for 8 years.    PLOF: Independent   PATIENT GOALS: To relieve the pain.    NEXT MD VISIT: N/A   OBJECTIVE:    DIAGNOSTIC FINDINGS:  N/A    PATIENT SURVEYS:  FOTO 81/88   SCREENING FOR RED FLAGS: Bowel or bladder incontinence: No Spinal tumors: No Cauda equina syndrome: No  Compression fracture: No Abdominal aneurysm: No   COGNITION: Overall cognitive status: Within functional limits for tasks assessed                          SENSATION: WFL   MUSCLE LENGTH: Hamstrings: WFL bilat    SI Joint Cluster Testing ([place])  Thigh Thrust: Negative Sacral Thrust: Negative Distraction: Negative Gaenslen's: Negative     POSTURE: No Significant postural limitations   PALPATION: TTP at L piriformis. Significant difference in tenderness in comparison to R side.    LUMBAR ROM: All AROM w/ OP pain free.    AROM eval  Flexion WNL  Extension WNL  Right lateral flexion WNL  Left lateral flexion WNL  Right rotation WNL  Left rotation WNL   (Blank rows = not tested)   LOWER EXTREMITY ROM: All bilat LE AROM WNL    LOWER EXTREMITY MMT:     MMT Right eval Left eval  Hip flexion 5 4+  Hip extension 4+ 4  Hip abduction 4+ 4  Hip adduction 4 4  Hip internal rotation 5 5  Hip external rotation 4+ 3+*  Knee flexion 4+ 4  Knee extension 5 5  Ankle dorsiflexion 5 5  Ankle plantarflexion 5 5  Ankle inversion      Ankle eversion       (Blank rows = not tested)   LUMBAR SPECIAL TESTS:  SI Compression/distraction test: Negative and FABER test: Negative Piriformis stretch test: positive, concordant sx FADDIR: Negative bilat   FUNCTIONAL TESTS:  Single leg squat from stair: No concordant sx noted x8 bilat.     TODAY'S TREATMENT:                                                                                                                               DATE: 02/16/22   There.ex: LLE Supine Figure 4 Piriformis Stretch with Leg Extension  3 x30 sec hold  Supine Bridge with Glute Set 2 x10; 3 sec hold  Sidelying Clamshell RLE/ LLE with RTB at bilat knees 2 x12/ each side Sidelying Reverse Clamshell RLE/LLE with RTB at bilat ankles 2 x12/ each side   PATIENT EDUCATION:  Education details: HEP provided Person educated: Patient Education method: Medical illustrator Education comprehension: verbalized understanding and returned demonstration   HOME EXERCISE PROGRAM: Access Code: [email or web address] URL: [email or web address] Date: 02/18/2023 Prepared by: Ronnie Derby   Exercises - Supine Figure 4 Piriformis Stretch with Leg Extension  - 1 x daily - 7 x weekly - 3 sets - 30 hold - Supine Bridge with Gluteal Set and Spinal Articulation  - 1 x daily - 7 x weekly - 2 sets - 10 reps - Clamshell with Resistance  - 1 x daily - 7 x weekly - 2 sets - 12 reps - Sidelying Reverse Clamshell  - 1 x daily - 7 x weekly - 2 sets - 12 reps  ASSESSMENT:   CLINICAL IMPRESSION: Patient is a 37 y.o. F who was seen today for physical therapy evaluation and treatment for chronic L sided piriformis pain. Upon examination, pt presents with TTP at the L piriformis with no sx of radiating pain down LE's. Pt's SIJ dysfunction testing appears to be negative along with no hip provocation test causing concordant pain. Pt displays increased tissue extensibility displayed with lumbar and bilat LE ROM, noting no significant muscle length discrepancies. Overall pt's LE strength are grossly WNL, except for L hip external rotation noting moderate weakness and concordant pain with this movement. Pt's pain is limiting her from completing standing tasks at work, including standing to chart patient's notes, along with impedes her ability to drive >95 minutes before having to take a stretch break to reduce LLE  pain. Pt would benefit from skilled PT interventions to address pain and strength deficits to improve QoL and return to PLOF.   OBJECTIVE IMPAIRMENTS: decreased strength and pain.    ACTIVITY LIMITATIONS: sitting and standing   PARTICIPATION LIMITATIONS: driving and community activity   PERSONAL FACTORS: Age, Education, Fitness, and Time since onset of injury/illness/exacerbation are also affecting patient's functional outcome.    REHAB POTENTIAL: Excellent   CLINICAL DECISION MAKING: Stable/uncomplicated   EVALUATION COMPLEXITY: Low     GOALS: Goals reviewed with patient? Yes   SHORT TERM GOALS: Target date: 03/17/2023   Pt will be independent with HEP to improve LLE pain and strength to improve standing and sitting tasks.  Baseline: 02/17/23: HEP given to pt  Goal status: INITIAL   LONG TERM GOALS: Target date: 03/31/2023   Pt will improve FOTO to target score to demonstrate clinically significant improvement in functional mobility.  Baseline: 02/17/23: 81/88 Goal status: INITIAL   2.  Pt will self report ability to stand > 30 min with less than 6/10 NPS to demonstrate clinically significant improvement in pain with work tasks and standing ADL's at home.  Baseline: 02/17/23: 8/10 Goal status: INITIAL   3.  Pt will self report the ability to drive >62 min with less than 6/10 NPS to demonstrate clinically significant improvement in pain with community ADL's that involve traveling longer distances.  Baseline: 02/17/23: 8/10 Goal status: INITIAL       4.  Pt will improve LLE hip ER MMT to be pain-free and 5/5 to demonstrate improved hip stability and muscle imbalance with standing/walking tasks.  Baseline: 02/17/23: 3+/5, concordant hip pain Goal status: INITIAL     PLAN:   PT FREQUENCY: 1x/week   PT DURATION: 6 weeks   PLANNED INTERVENTIONS: Therapeutic exercises, Therapeutic activity, Neuromuscular re-education, Balance training, Gait training, Patient/Family  education, Self Care, Joint mobilization, Joint manipulation, Spinal manipulation, Spinal mobilization, and Manual therapy.   PLAN FOR NEXT SESSION: Review HEP, manual therapy for piriformis relief, hip ER strengthening exercises.    Kristine Royal, SPT   Delphia Grates. Fairly IV, PT, DPT Physical Therapist- [place]  [facility]

## 2023-03-04 ENCOUNTER — Other Ambulatory Visit: Payer: Self-pay

## 2023-03-04 MED ORDER — VITAMIN D3 1.25 MG (50000 UT) PO CAPS
1.2500 mg | ORAL_CAPSULE | ORAL | 0 refills | Status: DC
  Filled 2023-03-04: qty 4, 28d supply, fill #0
  Filled 2023-03-04: qty 12, 84d supply, fill #0

## 2023-03-07 ENCOUNTER — Ambulatory Visit: Payer: 59

## 2023-03-11 ENCOUNTER — Ambulatory Visit: Payer: 59

## 2023-03-12 DIAGNOSIS — N96 Recurrent pregnancy loss: Secondary | ICD-10-CM | POA: Diagnosis not present

## 2023-03-12 DIAGNOSIS — N979 Female infertility, unspecified: Secondary | ICD-10-CM | POA: Diagnosis not present

## 2023-03-17 ENCOUNTER — Ambulatory Visit: Payer: 59

## 2023-03-17 DIAGNOSIS — E559 Vitamin D deficiency, unspecified: Secondary | ICD-10-CM | POA: Diagnosis not present

## 2023-03-17 DIAGNOSIS — N96 Recurrent pregnancy loss: Secondary | ICD-10-CM | POA: Diagnosis not present

## 2023-03-17 DIAGNOSIS — E288 Other ovarian dysfunction: Secondary | ICD-10-CM | POA: Diagnosis not present

## 2023-03-17 DIAGNOSIS — H9313 Tinnitus, bilateral: Secondary | ICD-10-CM | POA: Diagnosis not present

## 2023-03-17 DIAGNOSIS — N979 Female infertility, unspecified: Secondary | ICD-10-CM | POA: Diagnosis not present

## 2023-03-19 ENCOUNTER — Other Ambulatory Visit: Payer: Self-pay

## 2023-03-19 ENCOUNTER — Encounter: Payer: Self-pay | Admitting: Pharmacist

## 2023-03-19 MED ORDER — METHYL-FOLATE 1000 MCG PO CAPS: 1.0000 | ORAL_CAPSULE | Freq: Every day | ORAL | 4 refills | Status: DC

## 2023-03-31 ENCOUNTER — Other Ambulatory Visit: Payer: Self-pay

## 2023-03-31 ENCOUNTER — Ambulatory Visit: Payer: 59 | Attending: Internal Medicine

## 2023-03-31 DIAGNOSIS — M6281 Muscle weakness (generalized): Secondary | ICD-10-CM | POA: Insufficient documentation

## 2023-03-31 DIAGNOSIS — M25552 Pain in left hip: Secondary | ICD-10-CM | POA: Insufficient documentation

## 2023-03-31 NOTE — Therapy (Addendum)
OUTPATIENT PHYSICAL THERAPY THORACOLUMBAR TREATMENT/ RE-CERT     Patient Name: Maria Cohen MRN: [id number] DOB:06-Jun-1986, 37 y.o., female Today's Date: 02/18/2023   END OF SESSION:   PT End of Session - 02/17/23 1639       Visit Number 3    Number of Visits 13    Date for PT Re-Evaluation 05/12/2023     PT Start Time 1300     PT Stop Time 1338    PT Time Calculation (min) 38 min     Activity Tolerance Patient tolerated treatment well     Behavior During Therapy [facility] for tasks assessed/performed                       Past Medical History:  Diagnosis Date   Chronic constipation     Raynaud's disease               Past Surgical History:  Procedure Laterality Date   FINGER SURGERY        Rheumatoid nodule   WISDOM TOOTH EXTRACTION                Patient Active Problem List    Diagnosis Date Noted   Chronic constipation 11/14/2020   Raynaud's disease 11/14/2020      PCP: Lorre Munroe, NP   REFERRING PROVIDER: Lorre Munroe, NP   REFERRING DIAG: G57.02 (ICD-10-CM) - Piriformis syndrome of left side   Rationale for Evaluation and Treatment: Rehabilitation   THERAPY DIAG:  Pain in left hip   Muscle weakness (generalized)   ONSET DATE: 2.5 years   SUBJECTIVE:                                                                                                                                                                                            SUBJECTIVE STATEMENT:  Pt reports improvements in overall LLE pain. She reports 2/10 NPS in the LLE today and reports she has been compliant with her HEP.    PERTINENT HISTORY:  Pt presents to PT with chronic L sided piriformis pain syndrome. 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 This positioning includes excess adduction and "tree pose" resulting in increased pain 3-8/10 stemming from the L piriformis muscle. Pt reports no  N/T down LE and can pinpoint her pain at the L piriformis. Pt's aggravating factors include standing and sitting for prolonged periods of time and her pain has only been eased somewhat by performing a prolonged piriformis stretch and with walking >30 minutes.  PAIN:  Are you having pain? Yes: NPRS scale: 2/10 Pain location: L piriformis muscle Pain description: Dull/ Achy Aggravating factors: Prolonged sitting or standing >15 minutes, after a long work week. Relieving factors: Walking, pt states she begins to feel relief after 30 min of walking.  Max pain of 8/10 after a long week of work.  PRECAUTIONS: None   RED FLAGS: Bowel or bladder incontinence: No, Spinal tumors: No, and Cauda equina syndrome: No            WEIGHT BEARING RESTRICTIONS: No   FALLS:  Has patient fallen in last 6 months? No     OCCUPATION: Pt has been a Publishing rights manager for 8 years.    PLOF: Independent   PATIENT GOALS: To relieve the pain.    NEXT MD VISIT: N/A   OBJECTIVE:    DIAGNOSTIC FINDINGS:  N/A    PATIENT SURVEYS:  FOTO 81/88   SCREENING FOR RED FLAGS: Bowel or bladder incontinence: No Spinal tumors: No Cauda equina syndrome: No Compression fracture: No Abdominal aneurysm: No   COGNITION: Overall cognitive status: Within functional limits for tasks assessed                          SENSATION: WFL   MUSCLE LENGTH: Hamstrings: WFL bilat    SI Joint Cluster Testing ([place])  Thigh Thrust: Negative Sacral Thrust: Negative Distraction: Negative Gaenslen's: Negative     POSTURE: No Significant postural limitations   PALPATION: TTP at L piriformis. Significant difference in tenderness in comparison to R side.    LUMBAR ROM: All AROM w/ OP pain free.    AROM eval  Flexion WNL  Extension WNL  Right lateral flexion WNL  Left lateral flexion WNL  Right rotation WNL  Left rotation WNL   (Blank rows = not tested)   LOWER EXTREMITY ROM: All bilat LE AROM WNL    LOWER  EXTREMITY MMT:     MMT Right eval Left eval Left  03/31/23  Hip flexion 5 4+   Hip extension 4+ 4   Hip abduction 4+ 4   Hip adduction 4 4   Hip internal rotation 5 5   Hip external rotation 4+ 3+* 4-  Knee flexion 4+ 4   Knee extension 5 5   Ankle dorsiflexion 5 5   Ankle plantarflexion 5 5   Ankle inversion       Ankle eversion        (Blank rows = not tested)   LUMBAR SPECIAL TESTS:  SI Compression/distraction test: Negative and FABER test: Negative Piriformis stretch test: positive, concordant sx FADDIR: Negative bilat   FUNCTIONAL TESTS:  Single leg squat from stair: No concordant sx noted x8 bilat.     TODAY'S TREATMENT:  DATE: 03/31/23    There.ex: Supine Figure 4 Piriformis stretch on LLE 2 x30sec Supine single leg bridge RLE/LLE 2 x10/ each side Sidelying Clamshell RLE/LLE 2 x12/ each side Lateral side stepping w/ Blue TB 3 x10'  Lunges at second step on staircase RLE/LLE x10/ each side Single leg dead lifts w/ 5# DB RLEx5, LLE x10  Single leg Bulgarian split squats RLE/LLE 2x10  Standing hip extension RLE/LLE 2 x10   PATIENT EDUCATION:  Education details: HEP provided Person educated: Patient Education method: Medical illustrator Education comprehension: verbalized understanding and returned demonstration   HOME EXERCISE PROGRAM: Access Code: [email or web address] URL: [email or web address] Date: 03/31/2023 Prepared by: Ronnie Derby  Exercises - Supine Figure 4 Piriformis Stretch with Leg Extension  - 1 x daily - 7 x weekly - 3 sets - 30 hold - Single Leg Bridge  - 1 x daily - 3-4 x weekly - 3 sets - 8 reps - Sidelying Reverse Clamshell  - 1 x daily - 3-4 x weekly - 2 sets - 12 reps - Sidelying Forearm Plank with Clam and Punch  - 1 x daily - 3-4 x weekly - 3 sets - 12 reps - Side Stepping with Resistance at Ankles   - 1 x daily - 3-4 x weekly - 2 sets - 4 reps - Trail Leg Lunge  - 1 x daily - 3-4 x weekly - 3 sets - 10 reps - Single-Leg [place] Deadlift With Kettlebell  - 1 x daily - 3-4 x weekly - 2 sets - 6-10 reps   ASSESSMENT:   CLINICAL IMPRESSION:  Session focused on reviewing pt's STG and LTG's as pt requires re-certification for continued physical therapy along w/ updating pt's HEP. Upon review, pt has achieved 4/5 of her goals including exceeding her FOTO goal and reaching her pain goals, along w/ HEP compliance. Though pt has achieved her initial pain goals noting a decrease in pain levels, however she continues to have increased pain w/ prolonged standing and sitting. Pt also did not meet her LLE hip ER strength goal, but does show progression since evaluation. Pt's HEP was updated to emphasize single leg strengthening activities, and progressing her current exercises. Pt would continue to benefit from an additional 1x/week for an additional 6 weeks of skilled PT interventions to address pain and strength deficits to improve QoL and return to PLOF.   OBJECTIVE IMPAIRMENTS: decreased strength and pain.    ACTIVITY LIMITATIONS: sitting and standing   PARTICIPATION LIMITATIONS: driving and community activity   PERSONAL FACTORS: Age, Education, Fitness, and Time since onset of injury/illness/exacerbation are also affecting patient's functional outcome.    REHAB POTENTIAL: Excellent   CLINICAL DECISION MAKING: Stable/uncomplicated   EVALUATION COMPLEXITY: Low     GOALS: Goals reviewed with patient? Yes   SHORT TERM GOALS: Target date: 03/17/2023   Pt will be independent with HEP to improve LLE pain and strength to improve standing and sitting tasks.  Baseline: 02/17/23: HEP given to pt  03/31/23: HEP compliant Goal status: MET   LONG TERM GOALS: Target date: 05/12/2023   Pt will improve FOTO to target score to demonstrate clinically significant improvement in functional mobility.   Baseline: 02/17/23: 81/88 03/31/23: 91/88 Goal status: MET   2.  Pt will self report ability to stand > 30 min with less than 6/10 NPS to demonstrate clinically significant improvement in pain with work tasks and standing ADL's at home.  Baseline: 02/17/23: 8/10; 03/31/23:1/10 Goal status: MET   3.  Pt  will self report the ability to drive >69 min with less than 6/10 NPS to demonstrate clinically significant improvement in pain with community ADL's that involve traveling longer distances.  Baseline: 02/17/23: 8/10; 03/31/23: 4/10 Goal status: MET     4.  Pt will improve LLE hip ER MMT to be pain-free and 5/5 to demonstrate improved hip stability and muscle imbalance with standing/walking tasks.  Baseline: 02/17/23: 3+/5, concordant hip pain; 03/31/23: 4-/5 Goal status: ONGOING  5. Pt will self report the ability to drive >62 min with less than 2/10 NPS to demonstrate clinically significant improvement in pain with community ADL's that involve traveling longer distances.   Baseline: 03/31/23: 4/10 NPS  Goal status: NEW     PLAN:   PT FREQUENCY: 1x/week   PT DURATION: 6 weeks   PLANNED INTERVENTIONS: Therapeutic exercises, Therapeutic activity, Neuromuscular re-education, Balance training, Gait training, Patient/Family education, Self Care, Joint mobilization, Joint manipulation, Spinal manipulation, Spinal mobilization, and Manual therapy.   PLAN FOR NEXT SESSION: Reassess updates to HEP, hip ER strengthening exercises.    Kristine Royal, SPT   Delphia Grates. Fairly IV, PT, DPT Physical Therapist- [place]  [facility]

## 2023-04-07 ENCOUNTER — Ambulatory Visit: Payer: 59

## 2023-04-07 DIAGNOSIS — M25552 Pain in left hip: Secondary | ICD-10-CM

## 2023-04-07 DIAGNOSIS — M6281 Muscle weakness (generalized): Secondary | ICD-10-CM

## 2023-04-07 NOTE — Therapy (Addendum)
OUTPATIENT PHYSICAL THERAPY THORACOLUMBAR TREATMENT     Patient Name: Maria Cohen MRN: [id number] DOB:05-27-1986, 37 y.o., female Today's Date: 02/18/2023   END OF SESSION:   PT End of Session - 02/17/23 1639       Visit Number 4    Number of Visits 13    Date for PT Re-Evaluation 05/12/2023     PT Start Time 1345    PT Stop Time 1438    PT Time Calculation (min) 43 min     Activity Tolerance Patient tolerated treatment well     Behavior During Therapy WFL for tasks assessed/performed                       Past Medical History:  Diagnosis Date   Chronic constipation     Raynaud's disease               Past Surgical History:  Procedure Laterality Date   FINGER SURGERY        Rheumatoid nodule   WISDOM TOOTH EXTRACTION                Patient Active Problem List    Diagnosis Date Noted   Chronic constipation 11/14/2020   Raynaud's disease 11/14/2020      PCP: Lorre Munroe, NP   REFERRING PROVIDER: Lorre Munroe, NP   REFERRING DIAG: G57.02 (ICD-10-CM) - Piriformis syndrome of left side   Rationale for Evaluation and Treatment: Rehabilitation   THERAPY DIAG:  Pain in left hip   Muscle weakness (generalized)   ONSET DATE: 2.5 years   SUBJECTIVE:                                                                                                                                                                                            SUBJECTIVE STATEMENT:  Pt reports increased stiffness in bilat LE's following less activity this weekend, w/ more sitting.    PERTINENT HISTORY:  Pt presents to PT with chronic L sided piriformis pain syndrome. 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 This positioning includes excess adduction and "tree pose" resulting in increased pain 3-8/10 stemming from the L piriformis muscle. Pt reports no N/T down LE and can pinpoint her pain at  the L piriformis. Pt's aggravating factors include standing and sitting for prolonged periods of time and her pain has only been eased somewhat by performing a prolonged piriformis stretch and with walking >30 minutes.    PAIN:  Are you having pain? Yes: NPRS scale: 2/10 Pain  location: L piriformis muscle Pain description: Dull/ Achy Aggravating factors: Prolonged sitting or standing >15 minutes, after a long work week. Relieving factors: Walking, pt states she begins to feel relief after 30 min of walking.  Max pain of 8/10 after a long week of work.  PRECAUTIONS: None   RED FLAGS: Bowel or bladder incontinence: No, Spinal tumors: No, and Cauda equina syndrome: No            WEIGHT BEARING RESTRICTIONS: No   FALLS:  Has patient fallen in last 6 months? No     OCCUPATION: Pt has been a Publishing rights manager for 8 years.    PLOF: Independent   PATIENT GOALS: To relieve the pain.    NEXT MD VISIT: N/A   OBJECTIVE:    DIAGNOSTIC FINDINGS:  N/A    PATIENT SURVEYS:  FOTO 81/88   SCREENING FOR RED FLAGS: Bowel or bladder incontinence: No Spinal tumors: No Cauda equina syndrome: No Compression fracture: No Abdominal aneurysm: No   COGNITION: Overall cognitive status: Within functional limits for tasks assessed                          SENSATION: WFL   MUSCLE LENGTH: Hamstrings: WFL bilat    SI Joint Cluster Testing ([place])  Thigh Thrust: Negative Sacral Thrust: Negative Distraction: Negative Gaenslen's: Negative     POSTURE: No Significant postural limitations   PALPATION: TTP at L piriformis. Significant difference in tenderness in comparison to R side.    LUMBAR ROM: All AROM w/ OP pain free.    AROM eval  Flexion WNL  Extension WNL  Right lateral flexion WNL  Left lateral flexion WNL  Right rotation WNL  Left rotation WNL   (Blank rows = not tested)   LOWER EXTREMITY ROM: All bilat LE AROM WNL    LOWER EXTREMITY MMT:     MMT Right eval  Left eval Left  03/31/23  Hip flexion 5 4+   Hip extension 4+ 4   Hip abduction 4+ 4   Hip adduction 4 4   Hip internal rotation 5 5   Hip external rotation 4+ 3+* 4-  Knee flexion 4+ 4   Knee extension 5 5   Ankle dorsiflexion 5 5   Ankle plantarflexion 5 5   Ankle inversion       Ankle eversion        (Blank rows = not tested)   LUMBAR SPECIAL TESTS:  SI Compression/distraction test: Negative and FABER test: Negative Piriformis stretch test: positive, concordant sx FADDIR: Negative bilat   FUNCTIONAL TESTS:  Single leg squat from stair: No concordant sx noted x8 bilat.     TODAY'S TREATMENT:                                                                                                                              DATE: 04/07/23    There.ex: Treadmill x 5  minutes lvl 3.0 incline 4.0  Monster walks w/ blue TB 3 x10'  Deep squat w/ bilat ER w/ 10# KB  Single leg Bulgarian split squats RLE/LLE 2x10  Standing hip abduction at MATRIX machine LLE 40# 2 x10, RLE 1 x10 #40, 1 x10 55# Standing hip extension at MATRIC machine RLE 70# 2 x10, LLE #55 2 x10 Standing RDL's w/ 10# KB onto 8" step 2 x10 Pigeon pose RLE/LLE 2 x30sec each Supine single leg bridges RLE/ LLE x12/ each side Supine bridge w/ hamstring curl w/ clear physioball 2x10  Alternating lunges at second step on staircase RLE/LLE 2x 12/ each side  PATIENT EDUCATION:  Education details: HEP provided Person educated: Patient Education method: Medical illustrator Education comprehension: verbalized understanding and returned demonstration   HOME EXERCISE PROGRAM: Access Code: [email or web address] URL: [email or web address] Date: 03/31/2023 Prepared by: Ronnie Derby  Exercises - Supine Figure 4 Piriformis Stretch with Leg Extension  - 1 x daily - 7 x weekly - 3 sets - 30 hold - Single Leg Bridge  - 1 x daily - 3-4 x weekly - 3 sets - 8 reps - Sidelying Reverse Clamshell  - 1 x daily - 3-4 x weekly -  2 sets - 12 reps - Sidelying Forearm Plank with Clam and Punch  - 1 x daily - 3-4 x weekly - 3 sets - 12 reps - Side Stepping with Resistance at Ankles  - 1 x daily - 3-4 x weekly - 2 sets - 4 reps - Trail Leg Lunge  - 1 x daily - 3-4 x weekly - 3 sets - 10 reps - Single-Leg [place] Deadlift With Kettlebell  - 1 x daily - 3-4 x weekly - 2 sets - 6-10 reps   ASSESSMENT:   CLINICAL IMPRESSION:  Session focused on progressing bilat LE strengthening and mobility. Pt notes improvements in bilat LE strength noted w/ improved activity tolerance to increased # utilized w/ exercise. Pt continues to note bilat LE weakness LLE> RLE noted w/ activities performed at today's session. Pt's HEP was updated to progress her bilat LE strengthening and hip flexibility exercises. Pt would continue to benefit from skilled PT interventions to address pain and strength deficits to improve QoL and return to PLOF.   OBJECTIVE IMPAIRMENTS: decreased strength and pain.    ACTIVITY LIMITATIONS: sitting and standing   PARTICIPATION LIMITATIONS: driving and community activity   PERSONAL FACTORS: Age, Education, Fitness, and Time since onset of injury/illness/exacerbation are also affecting patient's functional outcome.    REHAB POTENTIAL: Excellent   CLINICAL DECISION MAKING: Stable/uncomplicated   EVALUATION COMPLEXITY: Low     GOALS: Goals reviewed with patient? Yes   SHORT TERM GOALS: Target date: 03/17/2023   Pt will be independent with HEP to improve LLE pain and strength to improve standing and sitting tasks.  Baseline: 02/17/23: HEP given to pt  03/31/23: HEP compliant Goal status: MET   LONG TERM GOALS: Target date: 05/12/2023   Pt will improve FOTO to target score to demonstrate clinically significant improvement in functional mobility.  Baseline: 02/17/23: 81/88 03/31/23: 91/88 Goal status: MET   2.  Pt will self report ability to stand > 30 min with less than 6/10 NPS to demonstrate clinically  significant improvement in pain with work tasks and standing ADL's at home.  Baseline: 02/17/23: 8/10; 03/31/23:1/10 Goal status: MET   3.  Pt will self report the ability to drive >54 min with less than 6/10 NPS to demonstrate clinically significant improvement in pain  with community ADL's that involve traveling longer distances.  Baseline: 02/17/23: 8/10; 03/31/23: 4/10 Goal status: MET     4.  Pt will improve LLE hip ER MMT to be pain-free and 5/5 to demonstrate improved hip stability and muscle imbalance with standing/walking tasks.  Baseline: 02/17/23: 3+/5, concordant hip pain; 03/31/23: 4-/5 Goal status: ONGOING  5. Pt will self report the ability to drive >82 min with less than 2/10 NPS to demonstrate clinically significant improvement in pain with community ADL's that involve traveling longer distances.   Baseline: 03/31/23: 4/10 NPS  Goal status: NEW     PLAN:   PT FREQUENCY: 1x/week   PT DURATION: 6 weeks   PLANNED INTERVENTIONS: Therapeutic exercises, Therapeutic activity, Neuromuscular re-education, Balance training, Gait training, Patient/Family education, Self Care, Joint mobilization, Joint manipulation, Spinal manipulation, Spinal mobilization, and Manual therapy.   PLAN FOR NEXT SESSION: Reassess updates to HEP, hip ER strengthening exercises.    Kristine Royal, SPT    Nolon Bussing, PT, DPT Physical Therapist - [facility]  04/07/23, 5:01 PM

## 2023-04-17 ENCOUNTER — Other Ambulatory Visit: Payer: Self-pay

## 2023-04-17 DIAGNOSIS — N979 Female infertility, unspecified: Secondary | ICD-10-CM | POA: Diagnosis not present

## 2023-04-17 DIAGNOSIS — N96 Recurrent pregnancy loss: Secondary | ICD-10-CM | POA: Diagnosis not present

## 2023-04-17 DIAGNOSIS — N711 Chronic inflammatory disease of uterus: Secondary | ICD-10-CM | POA: Diagnosis not present

## 2023-04-17 DIAGNOSIS — Z3141 Encounter for fertility testing: Secondary | ICD-10-CM | POA: Diagnosis not present

## 2023-04-17 MED ORDER — ESTRADIOL 0.1 MG/24HR TD PTTW
1.0000 | MEDICATED_PATCH | TRANSDERMAL | 4 refills | Status: DC
  Filled 2023-04-17: qty 8, 28d supply, fill #0

## 2023-04-17 MED ORDER — DOXYCYCLINE HYCLATE 100 MG PO CAPS
100.0000 mg | ORAL_CAPSULE | Freq: Two times a day (BID) | ORAL | 2 refills | Status: DC
  Filled 2023-04-17: qty 10, 5d supply, fill #0

## 2023-04-21 ENCOUNTER — Ambulatory Visit: Payer: 59 | Attending: Internal Medicine

## 2023-04-28 ENCOUNTER — Other Ambulatory Visit: Payer: Self-pay

## 2023-04-28 ENCOUNTER — Other Ambulatory Visit: Payer: Self-pay | Admitting: Internal Medicine

## 2023-04-28 MED ORDER — AMPHETAMINE-DEXTROAMPHETAMINE 15 MG PO TABS
15.0000 mg | ORAL_TABLET | Freq: Two times a day (BID) | ORAL | 0 refills | Status: DC
  Filled 2023-04-28: qty 60, 30d supply, fill #0

## 2023-05-08 DIAGNOSIS — Z3201 Encounter for pregnancy test, result positive: Secondary | ICD-10-CM | POA: Diagnosis not present

## 2023-05-08 DIAGNOSIS — Z3200 Encounter for pregnancy test, result unknown: Secondary | ICD-10-CM | POA: Diagnosis not present

## 2023-05-08 DIAGNOSIS — Z3183 Encounter for assisted reproductive fertility procedure cycle: Secondary | ICD-10-CM | POA: Diagnosis not present

## 2023-05-12 ENCOUNTER — Ambulatory Visit: Payer: 59 | Attending: Internal Medicine

## 2023-05-12 DIAGNOSIS — M6281 Muscle weakness (generalized): Secondary | ICD-10-CM | POA: Diagnosis not present

## 2023-05-12 DIAGNOSIS — M25552 Pain in left hip: Secondary | ICD-10-CM | POA: Diagnosis not present

## 2023-05-12 NOTE — Therapy (Cosign Needed)
OUTPATIENT PHYSICAL THERAPY THORACOLUMBAR TREATMENT/ RE-CERTIFICATION     Patient Name: Maria Cohen MRN: [id number] DOB:[birth year], 37 y.o., female Today's Date: 02/18/2023   END OF SESSION:   PT End of Session - 02/17/23 1639       Visit Number 5    Number of Visits 13    Date for PT Re-Evaluation 06/09/2023     PT Start Time 1600    PT Stop Time 1644    PT Time Calculation (min) 44 min     Activity Tolerance Patient tolerated treatment well     Behavior During Therapy [facility] for tasks assessed/performed                       Past Medical History:  Diagnosis Date   Chronic constipation     Raynaud's disease               Past Surgical History:  Procedure Laterality Date   FINGER SURGERY        Rheumatoid nodule   WISDOM TOOTH EXTRACTION                Patient Active Problem List    Diagnosis Date Noted   Chronic constipation 11/14/2020   Raynaud's disease 11/14/2020      PCP: Lorre Munroe, NP   REFERRING PROVIDER: Lorre Munroe, NP   REFERRING DIAG: G57.02 (ICD-10-CM) - Piriformis syndrome of left side   Rationale for Evaluation and Treatment: Rehabilitation   THERAPY DIAG:  Pain in left hip   Muscle weakness (generalized)   ONSET DATE: 2.5 years   SUBJECTIVE:                                                                                                                                                                                            SUBJECTIVE STATEMENT:  Pt reports 3/10 NPS in the L piriformis. She reports intermittent compliance with HEP, maybe leading to increased L piriformis pain.    PERTINENT HISTORY:  Pt presents to PT with chronic L sided piriformis pain syndrome. 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 This positioning includes excess adduction and "tree pose" resulting in increased pain 3-8/10 stemming from the L piriformis muscle. Pt  reports no N/T down LE and can pinpoint her pain at the L piriformis. Pt's aggravating factors include standing and sitting for prolonged periods of time and her pain has only been eased somewhat by performing a prolonged piriformis stretch and with walking >30 minutes.    PAIN:  Are you  having pain? Yes: NPRS scale: 3/10 Pain location: L piriformis muscle Pain description: Dull/ Achy Aggravating factors: Prolonged sitting or standing >15 minutes, after a long work week. Relieving factors: Walking, pt states she begins to feel relief after 30 min of walking.  Max pain of 8/10 after a long week of work.  PRECAUTIONS: None   RED FLAGS: Bowel or bladder incontinence: No, Spinal tumors: No, and Cauda equina syndrome: No            WEIGHT BEARING RESTRICTIONS: No   FALLS:  Has patient fallen in last 6 months? No     OCCUPATION: Pt has been a Publishing rights manager for 8 years.    PLOF: Independent   PATIENT GOALS: To relieve the pain.    NEXT MD VISIT: N/A   OBJECTIVE:    DIAGNOSTIC FINDINGS:  N/A    PATIENT SURVEYS:  FOTO 81/88   SCREENING FOR RED FLAGS: Bowel or bladder incontinence: No Spinal tumors: No Cauda equina syndrome: No Compression fracture: No Abdominal aneurysm: No   COGNITION: Overall cognitive status: Within functional limits for tasks assessed                          SENSATION: WFL   MUSCLE LENGTH: Hamstrings: WFL bilat    SI Joint Cluster Testing ([place])  Thigh Thrust: Negative Sacral Thrust: Negative Distraction: Negative Gaenslen's: Negative     POSTURE: No Significant postural limitations   PALPATION: TTP at L piriformis. Significant difference in tenderness in comparison to R side.    LUMBAR ROM: All AROM w/ OP pain free.    AROM eval  Flexion WNL  Extension WNL  Right lateral flexion WNL  Left lateral flexion WNL  Right rotation WNL  Left rotation WNL   (Blank rows = not tested)   LOWER EXTREMITY ROM: All bilat LE AROM WNL     LOWER EXTREMITY MMT:     MMT Right eval Left eval Left  03/31/23  Hip flexion 5 4+   Hip extension 4+ 4   Hip abduction 4+ 4   Hip adduction 4 4   Hip internal rotation 5 5   Hip external rotation 4+ 3+* 4-  Knee flexion 4+ 4   Knee extension 5 5   Ankle dorsiflexion 5 5   Ankle plantarflexion 5 5   Ankle inversion       Ankle eversion        (Blank rows = not tested)   LUMBAR SPECIAL TESTS:  SI Compression/distraction test: Negative and FABER test: Negative Piriformis stretch test: positive, concordant sx FADDIR: Negative bilat   FUNCTIONAL TESTS:  Single leg squat from stair: No concordant sx noted x8 bilat.     TODAY'S TREATMENT:                                                                                                                              DATE: 05/12/23  There.ex: Recumbent bike x 5 minutes lvl 4.0 Monster walks w/ blue TB 2 x20'  Curtsy lunges at TRX strap x10/ each side; w/ single UE support x10/ each side Deep squat w/ bilat ER w/ 10# KB 2 x12 Single leg bulgarian split squats w/ 5# DB RLE/LLE 2x10  Standing hip abduction at MATRIX machine RLE/ LLE 3 x10 55#/ each side  Standing hip extension at MATRIX machine RLE/ LLE 3 x10 70#/ each side Standing step ups onto 11.5" step w/ LLE and RLE hip thrust 2 x10  PATIENT EDUCATION:  Education details: HEP provided Person educated: Patient Education method: Medical illustrator Education comprehension: verbalized understanding and returned demonstration   HOME EXERCISE PROGRAM: Access Code: [email or web address] URL: [email or web address] Date: 03/31/2023 Prepared by: Ronnie Derby  Exercises - Supine Figure 4 Piriformis Stretch with Leg Extension  - 1 x daily - 7 x weekly - 3 sets - 30 hold - Single Leg Bridge  - 1 x daily - 3-4 x weekly - 3 sets - 8 reps - Sidelying Reverse Clamshell  - 1 x daily - 3-4 x weekly - 2 sets - 12 reps - Sidelying Forearm Plank with Clam and Punch  - 1 x  daily - 3-4 x weekly - 3 sets - 12 reps - Side Stepping with Resistance at Ankles  - 1 x daily - 3-4 x weekly - 2 sets - 4 reps - Trail Leg Lunge  - 1 x daily - 3-4 x weekly - 3 sets - 10 reps - Single-Leg [place] Deadlift With Kettlebell  - 1 x daily - 3-4 x weekly - 2 sets - 6-10 reps   ASSESSMENT:   CLINICAL IMPRESSION:  Session focused on reassessing the pt's remaining goals, as she requires re-certification for continued skilled PT interventions. Pt successfully achieved her self-reported goal of driving for > 30 minutes with pain levels reduced to less than 2/10 NPS. Additionally, she has made progress in her LLE ER MMT, improving from 4-/5 to 4/5. However, the pt continues to experience sustained pain in the left piriformis and associated muscle weakness, which limit her ability to perform necessary ADL's and work-related tasks pain-free. Pt's HEP was updated to emphasize bilateral hip strengthening, and the pt would benefit from 1x/week for an additional four weeks to address remaining LLE pain and strength deficits.   OBJECTIVE IMPAIRMENTS: decreased strength and pain.    ACTIVITY LIMITATIONS: sitting and standing   PARTICIPATION LIMITATIONS: driving and community activity   PERSONAL FACTORS: Age, Education, Fitness, and Time since onset of injury/illness/exacerbation are also affecting patient's functional outcome.    REHAB POTENTIAL: Excellent   CLINICAL DECISION MAKING: Stable/uncomplicated   EVALUATION COMPLEXITY: Low     GOALS: Goals reviewed with patient? Yes   SHORT TERM GOALS: Target date: 03/17/2023   Pt will be independent with HEP to improve LLE pain and strength to improve standing and sitting tasks.  Baseline: 02/17/23: HEP given to pt  03/31/23: HEP compliant Goal status: MET   LONG TERM GOALS: Target date: 06/09/2023   Pt will improve FOTO to target score to demonstrate clinically significant improvement in functional mobility.  Baseline: 02/17/23: 81/88  03/31/23: 91/88 Goal status: MET   2.  Pt will self report ability to stand > 30 min with less than 6/10 NPS to demonstrate clinically significant improvement in pain with work tasks and standing ADL's at home.  Baseline: 02/17/23: 8/10; 03/31/23:1/10 Goal status: MET   3.  Pt will self report the ability to  drive >82 min with less than 6/10 NPS to demonstrate clinically significant improvement in pain with community ADL's that involve traveling longer distances.  Baseline: 02/17/23: 8/10; 03/31/23: 4/10 Goal status: MET     4.  Pt will improve LLE hip ER MMT to be pain-free and 5/5 to demonstrate improved hip stability and muscle imbalance with standing/walking tasks.  Baseline: 02/17/23: 3+/5, concordant hip pain; 03/31/23: 4-/5 05/12/23: 4/5  Goal status: ONGOING  5. Pt will self report the ability to drive >95 min with less than 2/10 NPS to demonstrate clinically significant improvement in pain with community ADL's that involve traveling longer distances.   Baseline: 03/31/23: 4/10 NPS 05/12/23: 0/10 NPS  Goal status: MET     PLAN:   PT FREQUENCY: 1x/week   PT DURATION: 4 weeks   PLANNED INTERVENTIONS: Therapeutic exercises, Therapeutic activity, Neuromuscular re-education, Balance training, Gait training, Patient/Family education, Self Care, Joint mobilization, Joint manipulation, Spinal manipulation, Spinal mobilization, and Manual therapy.   PLAN FOR NEXT SESSION: Reassess HEP update (curtsy lunge), continue to progress bilat LE (emphasis on LLE) hip strengthening   Lovie Macadamia, SPT  Delphia Grates. Fairly IV, PT, DPT Physical Therapist- [place]  [facility]  05/13/23, 8:21 AM

## 2023-05-19 ENCOUNTER — Ambulatory Visit: Payer: 59

## 2023-05-19 DIAGNOSIS — M25552 Pain in left hip: Secondary | ICD-10-CM | POA: Diagnosis not present

## 2023-05-19 DIAGNOSIS — M6281 Muscle weakness (generalized): Secondary | ICD-10-CM

## 2023-05-19 NOTE — Therapy (Unsigned)
OUTPATIENT PHYSICAL THERAPY THORACOLUMBAR TREATMENT     Patient Name: Maria Cohen MRN: [id number] DOB:1985-11-13, 37 y.o., female Today's Date: 02/18/2023   END OF SESSION:   PT End of Session - 02/17/23 1639       Visit Number 5    Number of Visits 13    Date for PT Re-Evaluation 06/09/2023     PT Start Time 1602    PT Stop Time 1645    PT Time Calculation (min) 44 min     Activity Tolerance Patient tolerated treatment well     Behavior During Therapy [facility] for tasks assessed/performed                       Past Medical History:  Diagnosis Date   Chronic constipation     Raynaud's disease               Past Surgical History:  Procedure Laterality Date   FINGER SURGERY        Rheumatoid nodule   WISDOM TOOTH EXTRACTION                Patient Active Problem List    Diagnosis Date Noted   Chronic constipation 11/14/2020   Raynaud's disease 11/14/2020      PCP: Lorre Munroe, NP   REFERRING PROVIDER: Lorre Munroe, NP   REFERRING DIAG: G57.02 (ICD-10-CM) - Piriformis syndrome of left side   Rationale for Evaluation and Treatment: Rehabilitation   THERAPY DIAG:  Pain in left hip   Muscle weakness (generalized)   ONSET DATE: 2.5 years   SUBJECTIVE:                                                                                                                                                                                            SUBJECTIVE STATEMENT:  Pt reports no pain in hip today. No new concerns since last session. Mild soreness from exercise progression.   PERTINENT HISTORY:  Pt presents to PT with chronic L sided piriformis pain syndrome. 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 This positioning includes excess adduction and "tree pose" resulting in increased pain 3-8/10 stemming from the L piriformis muscle. Pt reports no N/T down LE and can pinpoint her  pain at the L piriformis. Pt's aggravating factors include standing and sitting for prolonged periods of time and her pain has only been eased somewhat by performing a prolonged piriformis stretch and with walking >30 minutes.    PAIN:  Are you having pain? Yes: NPRS scale:  0/10 Pain location: L piriformis muscle Pain description: Dull/ Achy Aggravating factors: Prolonged sitting or standing >15 minutes, after a long work week. Relieving factors: Walking, pt states she begins to feel relief after 30 min of walking.  Max pain of 8/10 after a long week of work.  PRECAUTIONS: None   RED FLAGS: Bowel or bladder incontinence: No, Spinal tumors: No, and Cauda equina syndrome: No            WEIGHT BEARING RESTRICTIONS: No   FALLS:  Has patient fallen in last 6 months? No     OCCUPATION: Pt has been a Publishing rights manager for 8 years.    PLOF: Independent   PATIENT GOALS: To relieve the pain.    NEXT MD VISIT: N/A   OBJECTIVE:    DIAGNOSTIC FINDINGS:  N/A    PATIENT SURVEYS:  FOTO 81/88   SCREENING FOR RED FLAGS: Bowel or bladder incontinence: No Spinal tumors: No Cauda equina syndrome: No Compression fracture: No Abdominal aneurysm: No   COGNITION: Overall cognitive status: Within functional limits for tasks assessed                          SENSATION: WFL   MUSCLE LENGTH: Hamstrings: WFL bilat    SI Joint Cluster Testing ([place])  Thigh Thrust: Negative Sacral Thrust: Negative Distraction: Negative Gaenslen's: Negative     POSTURE: No Significant postural limitations   PALPATION: TTP at L piriformis. Significant difference in tenderness in comparison to R side.    LUMBAR ROM: All AROM w/ OP pain free.    AROM eval  Flexion WNL  Extension WNL  Right lateral flexion WNL  Left lateral flexion WNL  Right rotation WNL  Left rotation WNL   (Blank rows = not tested)   LOWER EXTREMITY ROM: All bilat LE AROM WNL    LOWER EXTREMITY MMT:     MMT  Right eval Left eval Left  03/31/23  Hip flexion 5 4+   Hip extension 4+ 4   Hip abduction 4+ 4   Hip adduction 4 4   Hip internal rotation 5 5   Hip external rotation 4+ 3+* 4-  Knee flexion 4+ 4   Knee extension 5 5   Ankle dorsiflexion 5 5   Ankle plantarflexion 5 5   Ankle inversion       Ankle eversion        (Blank rows = not tested)   LUMBAR SPECIAL TESTS:  SI Compression/distraction test: Negative and FABER test: Negative Piriformis stretch test: positive, concordant sx FADDIR: Negative bilat   FUNCTIONAL TESTS:  Single leg squat from stair: No concordant sx noted x8 bilat.     TODAY'S TREATMENT:                                                                                                                              DATE: 05/19/23   There.ex: TM 3.5  MPH at 3% incline x 7 minutes for warm up and posterior chain strengthening. Monster walks w/ blue TB  4x10'  Curtsy lunges at TRX strap x10/ each side; w/ single UE support 2x10/ each side Deep squat w/ bilat ER w/ 10# KB 2x12. VC's for hip hinging with good carryover. 8" drop down squat to jump plyometric: 2x6  Standing hip abduction at MATRIX machine RLE/ LLE 3 x10 55#/ each side  Standing hip extension at MATRIX machine RLE/ LLE 3 x10 70#/ each side Single leg RDL with SUE support: 2x6/LE. Initial VC's for form/technique with good carryover after cues  PATIENT EDUCATION:  Education details: HEP provided Person educated: Patient Education method: Medical illustrator Education comprehension: verbalized understanding and returned demonstration   HOME EXERCISE PROGRAM: Access Code: [email or web address] URL: [email or web address] Date: 03/31/2023 Prepared by: Ronnie Derby  Exercises - Supine Figure 4 Piriformis Stretch with Leg Extension  - 1 x daily - 7 x weekly - 3 sets - 30 hold - Single Leg Bridge  - 1 x daily - 3-4 x weekly - 3 sets - 8 reps - Sidelying Reverse Clamshell  - 1 x daily - 3-4  x weekly - 2 sets - 12 reps - Sidelying Forearm Plank with Clam and Punch  - 1 x daily - 3-4 x weekly - 3 sets - 12 reps - Side Stepping with Resistance at Ankles  - 1 x daily - 3-4 x weekly - 2 sets - 4 reps - Trail Leg Lunge  - 1 x daily - 3-4 x weekly - 3 sets - 10 reps - Single-Leg [place] Deadlift With Kettlebell  - 1 x daily - 3-4 x weekly - 2 sets - 6-10 reps   ASSESSMENT:   CLINICAL IMPRESSION:  Continuing PT POC with progression of single leg strengthening and hip rotator stability exercises. Also introducing plyometric loading to LE's with pt denying any hip pain or related symptoms during today's session with progression. Mild education, demo, and cuing need but pt displays excellent carryover and tolerance. PT to continue with single limb loading and plyometrics to progress hip strength to ensure reduced pain levels with prolonged sitting, standing, and work related tasks to return to PLOF.   OBJECTIVE IMPAIRMENTS: decreased strength and pain.    ACTIVITY LIMITATIONS: sitting and standing   PARTICIPATION LIMITATIONS: driving and community activity   PERSONAL FACTORS: Age, Education, Fitness, and Time since onset of injury/illness/exacerbation are also affecting patient's functional outcome.    REHAB POTENTIAL: Excellent   CLINICAL DECISION MAKING: Stable/uncomplicated   EVALUATION COMPLEXITY: Low     GOALS: Goals reviewed with patient? Yes   SHORT TERM GOALS: Target date: 03/17/2023   Pt will be independent with HEP to improve LLE pain and strength to improve standing and sitting tasks.  Baseline: 02/17/23: HEP given to pt  03/31/23: HEP compliant Goal status: MET   LONG TERM GOALS: Target date: 06/09/2023   Pt will improve FOTO to target score to demonstrate clinically significant improvement in functional mobility.  Baseline: 02/17/23: 81/88 03/31/23: 91/88 Goal status: MET   2.  Pt will self report ability to stand > 30 min with less than 6/10 NPS to demonstrate  clinically significant improvement in pain with work tasks and standing ADL's at home.  Baseline: 02/17/23: 8/10; 03/31/23:1/10 Goal status: MET   3.  Pt will self report the ability to drive >69 min with less than 6/10 NPS to demonstrate clinically significant improvement in pain with community ADL's that involve traveling  longer distances.  Baseline: 02/17/23: 8/10; 03/31/23: 4/10 Goal status: MET     4.  Pt will improve LLE hip ER MMT to be pain-free and 5/5 to demonstrate improved hip stability and muscle imbalance with standing/walking tasks.  Baseline: 02/17/23: 3+/5, concordant hip pain; 03/31/23: 4-/5 05/12/23: 4/5  Goal status: ONGOING  5. Pt will self report the ability to drive >66 min with less than 2/10 NPS to demonstrate clinically significant improvement in pain with community ADL's that involve traveling longer distances.   Baseline: 03/31/23: 4/10 NPS 05/12/23: 0/10 NPS  Goal status: MET     PLAN:   PT FREQUENCY: 1x/week   PT DURATION: 4 weeks   PLANNED INTERVENTIONS: Therapeutic exercises, Therapeutic activity, Neuromuscular re-education, Balance training, Gait training, Patient/Family education, Self Care, Joint mobilization, Joint manipulation, Spinal manipulation, Spinal mobilization, and Manual therapy.   PLAN FOR NEXT SESSION: Continue with plyometric loading. Continue to progress bilat LE (emphasis on LLE) hip strengthening    Delphia Grates. Fairly IV, PT, DPT Physical Therapist- [place]  [facility]  05/20/23, 8:22 AM

## 2023-05-26 ENCOUNTER — Ambulatory Visit: Payer: 59

## 2023-05-26 DIAGNOSIS — M6281 Muscle weakness (generalized): Secondary | ICD-10-CM | POA: Diagnosis not present

## 2023-05-26 DIAGNOSIS — M25552 Pain in left hip: Secondary | ICD-10-CM

## 2023-05-26 NOTE — Therapy (Signed)
OUTPATIENT PHYSICAL THERAPY THORACOLUMBAR TREATMENT     Patient Name: Maria Cohen MRN: [id number] DOB:17-Nov-1985, 37 y.o., female Today's Date: 02/18/2023   END OF SESSION:   PT End of Session - 02/17/23 1639       Visit Number 5    Number of Visits 13    Date for PT Re-Evaluation 06/09/2023     PT Start Time 1602    PT Stop Time 1645    PT Time Calculation (min) 44 min     Activity Tolerance Patient tolerated treatment well     Behavior During Therapy [facility] for tasks assessed/performed                       Past Medical History:  Diagnosis Date   Chronic constipation     Raynaud's disease               Past Surgical History:  Procedure Laterality Date   FINGER SURGERY        Rheumatoid nodule   WISDOM TOOTH EXTRACTION                Patient Active Problem List    Diagnosis Date Noted   Chronic constipation 11/14/2020   Raynaud's disease 11/14/2020      PCP: Lorre Munroe, NP   REFERRING PROVIDER: Lorre Munroe, NP   REFERRING DIAG: G57.02 (ICD-10-CM) - Piriformis syndrome of left side   Rationale for Evaluation and Treatment: Rehabilitation   THERAPY DIAG:  Pain in left hip   Muscle weakness (generalized)   ONSET DATE: 2.5 years   SUBJECTIVE:                                                                                                                                                                                            SUBJECTIVE STATEMENT:  Pt reports no pain in hip today. No new concerns since last session. Mild soreness from exercise progression.   PERTINENT HISTORY:  Pt presents to PT with chronic L sided piriformis pain syndrome. 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 This positioning includes excess adduction and "tree pose" resulting in increased pain 3-8/10 stemming from the L piriformis muscle. Pt reports no N/T down LE and can pinpoint her  pain at the L piriformis. Pt's aggravating factors include standing and sitting for prolonged periods of time and her pain has only been eased somewhat by performing a prolonged piriformis stretch and with walking >30 minutes.    PAIN:  Are you having pain? Yes: NPRS scale:  0/10 Pain location: L piriformis muscle Pain description: Dull/ Achy Aggravating factors: Prolonged sitting or standing >15 minutes, after a long work week. Relieving factors: Walking, pt states she begins to feel relief after 30 min of walking.  Max pain of 8/10 after a long week of work.  PRECAUTIONS: None   RED FLAGS: Bowel or bladder incontinence: No, Spinal tumors: No, and Cauda equina syndrome: No            WEIGHT BEARING RESTRICTIONS: No   FALLS:  Has patient fallen in last 6 months? No     OCCUPATION: Pt has been a Publishing rights manager for 8 years.    PLOF: Independent   PATIENT GOALS: To relieve the pain.    NEXT MD VISIT: N/A   OBJECTIVE:    DIAGNOSTIC FINDINGS:  N/A    PATIENT SURVEYS:  FOTO 81/88   SCREENING FOR RED FLAGS: Bowel or bladder incontinence: No Spinal tumors: No Cauda equina syndrome: No Compression fracture: No Abdominal aneurysm: No   COGNITION: Overall cognitive status: Within functional limits for tasks assessed                          SENSATION: WFL   MUSCLE LENGTH: Hamstrings: WFL bilat    SI Joint Cluster Testing ([place])  Thigh Thrust: Negative Sacral Thrust: Negative Distraction: Negative Gaenslen's: Negative     POSTURE: No Significant postural limitations   PALPATION: TTP at L piriformis. Significant difference in tenderness in comparison to R side.    LUMBAR ROM: All AROM w/ OP pain free.    AROM eval  Flexion WNL  Extension WNL  Right lateral flexion WNL  Left lateral flexion WNL  Right rotation WNL  Left rotation WNL   (Blank rows = not tested)   LOWER EXTREMITY ROM: All bilat LE AROM WNL    LOWER EXTREMITY MMT:     MMT  Right eval Left eval Left  03/31/23  Hip flexion 5 4+   Hip extension 4+ 4   Hip abduction 4+ 4   Hip adduction 4 4   Hip internal rotation 5 5   Hip external rotation 4+ 3+* 4-  Knee flexion 4+ 4   Knee extension 5 5   Ankle dorsiflexion 5 5   Ankle plantarflexion 5 5   Ankle inversion       Ankle eversion        (Blank rows = not tested)   LUMBAR SPECIAL TESTS:  SI Compression/distraction test: Negative and FABER test: Negative Piriformis stretch test: positive, concordant sx FADDIR: Negative bilat   FUNCTIONAL TESTS:  Single leg squat from stair: No concordant sx noted x8 bilat.     TODAY'S TREATMENT:                                                                                                                              DATE: 05/19/23   There.ex: TM 4.0  MPH at 3% incline x 5 minutes for warm up and posterior chain strengthening. Clam shells with Black TB: 3x10 Reverse clam shells with black TB: 3x10  Single leg bridge on BOSU ball: 2x8/LE  [place] split squats: 2x8/LE  Sled pushes 90#: 4x10 meters  Single leg RDL with SUE support: 2x8/LE, with 10# KB. Min VC's for neutral pelvic positioning with good carryover post cuing.   Squat to overhead ball throw: 3x6, 3 KG med ball  Matrix hip extension: 85#, 3x8  Tandem on airex pad with 3 KG med ball tosses on re-bounder: 2x30 sec/LE  PATIENT EDUCATION:  Education details: HEP provided Person educated: Patient Education method: Medical illustrator Education comprehension: verbalized understanding and returned demonstration   HOME EXERCISE PROGRAM: Access Code: [email or web address] URL: [email or web address] Date: 03/31/2023 Prepared by: Ronnie Derby  Exercises - Supine Figure 4 Piriformis Stretch with Leg Extension  - 1 x daily - 7 x weekly - 3 sets - 30 hold - Single Leg Bridge  - 1 x daily - 3-4 x weekly - 3 sets - 8 reps - Sidelying Reverse Clamshell  - 1 x daily - 3-4 x weekly - 2 sets - 12  reps - Sidelying Forearm Plank with Clam and Punch  - 1 x daily - 3-4 x weekly - 3 sets - 12 reps - Side Stepping with Resistance at Ankles  - 1 x daily - 3-4 x weekly - 2 sets - 4 reps - Trail Leg Lunge  - 1 x daily - 3-4 x weekly - 3 sets - 10 reps - Single-Leg [place] Deadlift With Kettlebell  - 1 x daily - 3-4 x weekly - 2 sets - 6-10 reps   ASSESSMENT:   CLINICAL IMPRESSION:  Continuing PT POC with progression of power and single leg strength exercises. Pt continues to be pain free with progression. Updated TB exercises at home for black TB for hip rotation training. Educated on next session with HEP updates and progressions in hopes to progress hip strength for pain free driving and work related tasks. PT to continue with single limb loading and plyometrics to progress hip strength to ensure reduced pain levels with prolonged sitting, standing, and work related tasks to return to PLOF.   OBJECTIVE IMPAIRMENTS: decreased strength and pain.    ACTIVITY LIMITATIONS: sitting and standing   PARTICIPATION LIMITATIONS: driving and community activity   PERSONAL FACTORS: Age, Education, Fitness, and Time since onset of injury/illness/exacerbation are also affecting patient's functional outcome.    REHAB POTENTIAL: Excellent   CLINICAL DECISION MAKING: Stable/uncomplicated   EVALUATION COMPLEXITY: Low     GOALS: Goals reviewed with patient? Yes   SHORT TERM GOALS: Target date: 03/17/2023   Pt will be independent with HEP to improve LLE pain and strength to improve standing and sitting tasks.  Baseline: 02/17/23: HEP given to pt  03/31/23: HEP compliant Goal status: MET   LONG TERM GOALS: Target date: 06/09/2023   Pt will improve FOTO to target score to demonstrate clinically significant improvement in functional mobility.  Baseline: 02/17/23: 81/88 03/31/23: 91/88 Goal status: MET   2.  Pt will self report ability to stand > 30 min with less than 6/10 NPS to demonstrate clinically  significant improvement in pain with work tasks and standing ADL's at home.  Baseline: 02/17/23: 8/10; 03/31/23:1/10 Goal status: MET   3.  Pt will self report the ability to drive >47 min with less than 6/10 NPS to demonstrate clinically significant improvement in pain with community  ADL's that involve traveling longer distances.  Baseline: 02/17/23: 8/10; 03/31/23: 4/10 Goal status: MET     4.  Pt will improve LLE hip ER MMT to be pain-free and 5/5 to demonstrate improved hip stability and muscle imbalance with standing/walking tasks.  Baseline: 02/17/23: 3+/5, concordant hip pain; 03/31/23: 4-/5 05/12/23: 4/5  Goal status: ONGOING  5. Pt will self report the ability to drive >73 min with less than 2/10 NPS to demonstrate clinically significant improvement in pain with community ADL's that involve traveling longer distances.   Baseline: 03/31/23: 4/10 NPS 05/12/23: 0/10 NPS  Goal status: MET     PLAN:   PT FREQUENCY: 1x/week   PT DURATION: 4 weeks   PLANNED INTERVENTIONS: Therapeutic exercises, Therapeutic activity, Neuromuscular re-education, Balance training, Gait training, Patient/Family education, Self Care, Joint mobilization, Joint manipulation, Spinal manipulation, Spinal mobilization, and Manual therapy.   PLAN FOR NEXT SESSION: Update HEP. Continue with plyometric loading. Continue to progress bilat LE (emphasis on LLE) hip strengthening    Delphia Grates. Fairly IV, PT, DPT Physical Therapist- [facility]  05/26/23, 4:49 PM

## 2023-06-02 ENCOUNTER — Ambulatory Visit: Payer: 59

## 2023-06-02 DIAGNOSIS — M6281 Muscle weakness (generalized): Secondary | ICD-10-CM | POA: Diagnosis not present

## 2023-06-02 DIAGNOSIS — M25552 Pain in left hip: Secondary | ICD-10-CM | POA: Diagnosis not present

## 2023-06-02 NOTE — Therapy (Signed)
OUTPATIENT PHYSICAL THERAPY THORACOLUMBAR TREATMENT/DISCHARGE SUMMARY     Patient Name: Maria Cohen MRN: [id number] DOB:05-05-1986, 37 y.o., female Today's Date: 02/18/2023   END OF SESSION:   PT End of Session - 02/17/23 1639       Visit Number 6    Number of Visits 13    Date for PT Re-Evaluation 06/09/2023     PT Start Time 1602    PT Stop Time 1645    PT Time Calculation (min) 44 min     Activity Tolerance Patient tolerated treatment well     Behavior During Therapy [facility] for tasks assessed/performed                       Past Medical History:  Diagnosis Date   Chronic constipation     Raynaud's disease               Past Surgical History:  Procedure Laterality Date   FINGER SURGERY        Rheumatoid nodule   WISDOM TOOTH EXTRACTION                Patient Active Problem List    Diagnosis Date Noted   Chronic constipation 11/14/2020   Raynaud's disease 11/14/2020      PCP: Lorre Munroe, NP   REFERRING PROVIDER: Lorre Munroe, NP   REFERRING DIAG: G57.02 (ICD-10-CM) - Piriformis syndrome of left side   Rationale for Evaluation and Treatment: Rehabilitation   THERAPY DIAG:  Pain in left hip   Muscle weakness (generalized)   ONSET DATE: 2.5 years   SUBJECTIVE:                                                                                                                                                                                            SUBJECTIVE STATEMENT:  Pt reports maybe 40% better. Wondering if she should continue PT or discharge.  PERTINENT HISTORY:  Pt presents to PT with chronic L sided piriformis pain syndrome. 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 This positioning includes excess adduction and "tree pose" resulting in increased pain 3-8/10 stemming from the L piriformis muscle. Pt reports no N/T down LE and can pinpoint her pain at the L  piriformis. Pt's aggravating factors include standing and sitting for prolonged periods of time and her pain has only been eased somewhat by performing a prolonged piriformis stretch and with walking >30 minutes.    PAIN:  Are you having pain? Yes: NPRS scale: 0/10 Pain location: L piriformis  muscle Pain description: Dull/ Achy Aggravating factors: Prolonged sitting or standing >15 minutes, after a long work week. Relieving factors: Walking, pt states she begins to feel relief after 30 min of walking.  Max pain of 8/10 after a long week of work.  PRECAUTIONS: None   RED FLAGS: Bowel or bladder incontinence: No, Spinal tumors: No, and Cauda equina syndrome: No            WEIGHT BEARING RESTRICTIONS: No   FALLS:  Has patient fallen in last 6 months? No     OCCUPATION: Pt has been a Publishing rights manager for 8 years.    PLOF: Independent   PATIENT GOALS: To relieve the pain.    NEXT MD VISIT: N/A   OBJECTIVE:    DIAGNOSTIC FINDINGS:  N/A    PATIENT SURVEYS:  FOTO 81/88   SCREENING FOR RED FLAGS: Bowel or bladder incontinence: No Spinal tumors: No Cauda equina syndrome: No Compression fracture: No Abdominal aneurysm: No   COGNITION: Overall cognitive status: Within functional limits for tasks assessed                          SENSATION: WFL   MUSCLE LENGTH: Hamstrings: WFL bilat    SI Joint Cluster Testing ([place])  Thigh Thrust: Negative Sacral Thrust: Negative Distraction: Negative Gaenslen's: Negative     POSTURE: No Significant postural limitations   PALPATION: TTP at L piriformis. Significant difference in tenderness in comparison to R side.    LUMBAR ROM: All AROM w/ OP pain free.    AROM eval  Flexion WNL  Extension WNL  Right lateral flexion WNL  Left lateral flexion WNL  Right rotation WNL  Left rotation WNL   (Blank rows = not tested)   LOWER EXTREMITY ROM: All bilat LE AROM WNL    LOWER EXTREMITY MMT:     MMT Right eval Left eval  Left  03/31/23  Hip flexion 5 4+   Hip extension 4+ 4   Hip abduction 4+ 4   Hip adduction 4 4   Hip internal rotation 5 5   Hip external rotation 4+ 3+* 4-  Knee flexion 4+ 4   Knee extension 5 5   Ankle dorsiflexion 5 5   Ankle plantarflexion 5 5   Ankle inversion       Ankle eversion        (Blank rows = not tested)   LUMBAR SPECIAL TESTS:  SI Compression/distraction test: Negative and FABER test: Negative Piriformis stretch test: positive, concordant sx FADDIR: Negative bilat   FUNCTIONAL TESTS:  Single leg squat from stair: No concordant sx noted x8 bilat.     TODAY'S TREATMENT:                                                                                                                              DATE: 06/02/23   There.ex: Walking lunges: 4x20'  Sled pushes 90#:  4x10 meters  Jump squats: 2x6. PT demo and VC's for squat technique with good carryover after last session.  Single leg RDL with SUE support: 2x8/LE, with 10# DB.  Lateral alternating lunges: 2x8/LE with 10# DB  Reviewed clamshells and reverse clamshells with blue TB: 2x6/exercise on LLE.   Reviewed updated HEP on home management of condition with focus on reps/sets/frequency. Re-printed for patient.   PATIENT EDUCATION:  Education details: HEP provided Person educated: Patient Education method: Medical illustrator Education comprehension: verbalized understanding and returned demonstration   HOME EXERCISE PROGRAM: Access Code: [email or web address] URL: [email or web address] Date: 03/31/2023 Prepared by: Ronnie Derby  Exercises - Supine Figure 4 Piriformis Stretch with Leg Extension  - 1 x daily - 7 x weekly - 3 sets - 30 hold - Single Leg Bridge  - 1 x daily - 3-4 x weekly - 3 sets - 8 reps - Sidelying Reverse Clamshell  - 1 x daily - 3-4 x weekly - 2 sets - 12 reps - Sidelying Forearm Plank with Clam and Punch  - 1 x daily - 3-4 x weekly - 3 sets - 12 reps - Side Stepping with  Resistance at Ankles  - 1 x daily - 3-4 x weekly - 2 sets - 4 reps - Trail Leg Lunge  - 1 x daily - 3-4 x weekly - 3 sets - 10 reps - Single-Leg [place] Deadlift With Kettlebell  - 1 x daily - 3-4 x weekly - 2 sets - 6-10 reps   ASSESSMENT:   CLINICAL IMPRESSION: Pt at end of current POC. Pt has met all goals except for continued L hip ER weakness. Pt with future long travel for the holidays thus in agreement with PT to trial self management. Discussion with pt on importance of continuous LLE loading with emphasis on hip rotator muscle groups. Reviewed updated HEP reviewing reps/sets/frequency for home management. Pt understanding of HEP and all questions answered. Encouraged pt to receive new order if pt continues to have lingering issues going into 2025 to re-evaluate and treat if needed. Pt has maximized current PT benefits and is discharged at this time.   OBJECTIVE IMPAIRMENTS: decreased strength and pain.    ACTIVITY LIMITATIONS: sitting and standing   PARTICIPATION LIMITATIONS: driving and community activity   PERSONAL FACTORS: Age, Education, Fitness, and Time since onset of injury/illness/exacerbation are also affecting patient's functional outcome.    REHAB POTENTIAL: Excellent   CLINICAL DECISION MAKING: Stable/uncomplicated   EVALUATION COMPLEXITY: Low     GOALS: Goals reviewed with patient? Yes   SHORT TERM GOALS: Target date: 03/17/2023   Pt will be independent with HEP to improve LLE pain and strength to improve standing and sitting tasks.  Baseline: 02/17/23: HEP given to pt  03/31/23: HEP compliant Goal status: MET   LONG TERM GOALS: Target date: 06/09/2023   Pt will improve FOTO to target score to demonstrate clinically significant improvement in functional mobility.  Baseline: 02/17/23: 81/88 03/31/23: 91/88 Goal status: MET   2.  Pt will self report ability to stand > 30 min with less than 6/10 NPS to demonstrate clinically significant improvement in pain with  work tasks and standing ADL's at home.  Baseline: 02/17/23: 8/10; 03/31/23:1/10 Goal status: MET   3.  Pt will self report the ability to drive >14 min with less than 6/10 NPS to demonstrate clinically significant improvement in pain with community ADL's that involve traveling longer distances.  Baseline: 02/17/23: 8/10; 03/31/23: 4/10 Goal status: MET  4.  Pt will improve LLE hip ER MMT to be pain-free and 5/5 to demonstrate improved hip stability and muscle imbalance with standing/walking tasks.  Baseline: 02/17/23: 3+/5, concordant hip pain; 03/31/23: 4-/5 05/12/23: 4/5; 06/02/23: 4/5 Goal status: NOT MET   5. Pt will self report the ability to drive >56 min with less than 2/10 NPS to demonstrate clinically significant improvement in pain with community ADL's that involve traveling longer distances.   Baseline: 03/31/23: 4/10 NPS 05/12/23: 0/10 NPS  Goal status: MET     PLAN:   PT FREQUENCY: 1x/week   PT DURATION: 4 weeks   PLANNED INTERVENTIONS: Therapeutic exercises, Therapeutic activity, Neuromuscular re-education, Balance training, Gait training, Patient/Family education, Self Care, Joint mobilization, Joint manipulation, Spinal manipulation, Spinal mobilization, and Manual therapy.   PLAN FOR NEXT SESSION: N/A  Delphia Grates. Fairly IV, PT, DPT Physical Therapist- [facility]  06/02/23, 4:52 PM

## 2023-07-16 ENCOUNTER — Ambulatory Visit: Payer: 59 | Admitting: Dermatology

## 2023-07-28 DIAGNOSIS — H5213 Myopia, bilateral: Secondary | ICD-10-CM | POA: Diagnosis not present

## 2023-08-04 ENCOUNTER — Other Ambulatory Visit: Payer: Self-pay

## 2023-08-04 ENCOUNTER — Ambulatory Visit (INDEPENDENT_AMBULATORY_CARE_PROVIDER_SITE_OTHER): Payer: 59 | Admitting: Dermatology

## 2023-08-04 DIAGNOSIS — L814 Other melanin hyperpigmentation: Secondary | ICD-10-CM

## 2023-08-04 DIAGNOSIS — W908XXA Exposure to other nonionizing radiation, initial encounter: Secondary | ICD-10-CM | POA: Diagnosis not present

## 2023-08-04 DIAGNOSIS — D1801 Hemangioma of skin and subcutaneous tissue: Secondary | ICD-10-CM

## 2023-08-04 DIAGNOSIS — D229 Melanocytic nevi, unspecified: Secondary | ICD-10-CM

## 2023-08-04 DIAGNOSIS — Z1283 Encounter for screening for malignant neoplasm of skin: Secondary | ICD-10-CM

## 2023-08-04 DIAGNOSIS — D2272 Melanocytic nevi of left lower limb, including hip: Secondary | ICD-10-CM | POA: Diagnosis not present

## 2023-08-04 DIAGNOSIS — L739 Follicular disorder, unspecified: Secondary | ICD-10-CM

## 2023-08-04 DIAGNOSIS — Z808 Family history of malignant neoplasm of other organs or systems: Secondary | ICD-10-CM

## 2023-08-04 DIAGNOSIS — L578 Other skin changes due to chronic exposure to nonionizing radiation: Secondary | ICD-10-CM | POA: Diagnosis not present

## 2023-08-04 DIAGNOSIS — L821 Other seborrheic keratosis: Secondary | ICD-10-CM | POA: Diagnosis not present

## 2023-08-04 DIAGNOSIS — D2271 Melanocytic nevi of right lower limb, including hip: Secondary | ICD-10-CM

## 2023-08-04 DIAGNOSIS — D225 Melanocytic nevi of trunk: Secondary | ICD-10-CM

## 2023-08-04 DIAGNOSIS — Q825 Congenital non-neoplastic nevus: Secondary | ICD-10-CM

## 2023-08-04 MED ORDER — CLINDAMYCIN PHOSPHATE 1 % EX LOTN
TOPICAL_LOTION | CUTANEOUS | 11 refills | Status: AC
  Filled 2023-08-04: qty 60, 30d supply, fill #0
  Filled 2024-01-21: qty 60, 30d supply, fill #1
  Filled 2024-04-18: qty 60, 30d supply, fill #2

## 2023-08-04 NOTE — Progress Notes (Signed)
Follow-Up Visit   Subjective  Maria Cohen is a 38 y.o. female who presents for the following: Skin Cancer Screening and Full Body Skin Exam  The patient presents for Total-Body Skin Exam (TBSE) for skin cancer screening and mole check. The patient has spots, moles and lesions to be evaluated, some may be new or changing and the patient may have concern these could be cancer.    The following portions of the chart were reviewed this encounter and updated as appropriate: medications, allergies, medical history  Review of Systems:  No other skin or systemic complaints except as noted in HPI or Assessment and Plan.  Objective  Well appearing patient in no apparent distress; mood and affect are within normal limits.  A full examination was performed including scalp, head, eyes, ears, nose, lips, neck, chest, axillae, abdomen, back, buttocks, bilateral upper extremities, bilateral lower extremities, hands, feet, fingers, toes, fingernails, and toenails. All findings within normal limits unless otherwise noted below.   Relevant physical exam findings are noted in the Assessment and Plan.    Assessment & Plan   SKIN CANCER SCREENING PERFORMED TODAY.  ACTINIC DAMAGE - Chronic condition, secondary to cumulative UV/sun exposure - diffuse scaly erythematous macules with underlying dyspigmentation - Recommend daily broad spectrum sunscreen SPF 30+ to sun-exposed areas, reapply every 2 hours as needed.  - Staying in the shade or wearing long sleeves, sun glasses (UVA+UVB protection) and wide brim hats (4-inch brim around the entire circumference of the hat) are also recommended for sun protection.  - Call for new or changing lesions.  LENTIGINES, SEBORRHEIC KERATOSES, HEMANGIOMAS - Benign normal skin lesions - Benign-appearing - Call for any changes  MELANOCYTIC NEVI - Tan-brown and/or pink-flesh-colored symmetric macules and papules - Spinal mid back, 2 mm med dark brown macule -  Right medial lower thigh, 4 mm med light brown thin papule with notch  - L 5th toe 8 x 4 mm medium brown macule (photo compared from 05/23/2021, no changes). Came up within the past 5 years but stable in size and shape. - Benign appearing on exam today - Observation - Call clinic for new or changing moles - Recommend daily use of broad spectrum spf 30+ sunscreen to sun-exposed areas.   Nevus Spilus.  Left upper back. Manson Passey macules or papules within lighter tan patch - Genetic - Benign, observe - Call for any changes   CONGENITAL NEVI Exam: L post thigh 1.4 x 0.8 cm pink tan papule; Fleshy papule at left medial thigh  Treatment Plan: Benign appearing on exam today. Recommend observation. Call clinic for new or changing moles. Recommend daily use of broad spectrum spf 30+ sunscreen to sun-exposed areas.   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 Condition is symptomatic/ bothersome to patient. Not currently at goal.   Folliculitis occurs due to inflammation of the superficial hair follicle (pore), resulting in acne-like lesions (pus bumps). It can be infectious (bacterial, fungal) or noninfectious (shaving, tight clothing, heat/sweat, medications).  Folliculitis can be acute or chronic and recommended treatment depends on the underlying cause of folliculitis.  Treatment Plan: Recommend shaving with benzoyl peroxide wash or CLN wash. Samples of CeraVe and Cetaphil wash. Benzoyl peroxide can cause dryness and irritation of the skin. It can also bleach fabric.  Avoid close shave Start Clindamycin lotion Apply to AA once daily  after shower dsp 60 mL 1 yr Rf.  Return in about 1 year (around 08/03/2024) for TBSE.  ICherlyn Labella, CMA, am acting as scribe for Willeen Niece, MD  .   Documentation: I have reviewed the above documentation for accuracy and completeness, and I agree with the above.  Willeen Niece, MD

## 2023-08-04 NOTE — Patient Instructions (Addendum)
Melanoma ABCDEs  Melanoma is the most dangerous type of skin cancer, and is the leading cause of death from skin disease.  You are more likely to develop melanoma if you: Have light-colored skin, light-colored eyes, or red or blond hair Spend a lot of time in the sun Tan regularly, either outdoors or in a tanning bed Have had blistering sunburns, especially during childhood Have a close family member who has had a melanoma Have atypical moles or large birthmarks  Early detection of melanoma is key since treatment is typically straightforward and cure rates are extremely high if we catch it early.   The first sign of melanoma is often a change in a mole or a new dark spot.  The ABCDE system is a way of remembering the signs of melanoma.  A for asymmetry:  The two halves do not match. B for border:  The edges of the growth are irregular. C for color:  A mixture of colors are present instead of an even brown color. D for diameter:  Melanomas are usually (but not always) greater than 6mm - the size of a pencil eraser. E for evolution:  The spot keeps changing in size, shape, and color.  Please check your skin once per month between visits. You can use a small mirror in front and a large mirror behind you to keep an eye on the back side or your body.   If you see any new or changing lesions before your next follow-up, please call to schedule a visit.  Please continue daily skin protection including broad spectrum sunscreen SPF 30+ to sun-exposed areas, reapplying every 2 hours as needed when you're outdoors.   Staying in the shade or wearing long sleeves, sun glasses (UVA+UVB protection) and wide brim hats (4-inch brim around the entire circumference of the hat) are also recommended for sun protection.    Due to recent changes in healthcare laws, you may see results of your pathology and/or laboratory studies on MyChart before the doctors have had a chance to review them. We understand that in  some cases there may be results that are confusing or concerning to you. Please understand that not all results are received at the same time and often the doctors may need to interpret multiple results in order to provide you with the best plan of care or course of treatment. Therefore, we ask that you please give [place] 2 business days to thoroughly review all your results before contacting the office for clarification. Should we see a critical lab result, you will be contacted sooner.   If You Need Anything After Your Visit  If you have any questions or concerns for your doctor, please call our main line at [phone] and press option 4 to reach your doctor's medical assistant. If no one answers, please leave a voicemail as directed and we will return your call as soon as possible. Messages left after 4 pm will be answered the following business day.   You may also send [place] a message via MyChart. We typically respond to MyChart messages within 1-2 business days.  For prescription refills, please ask your pharmacy to contact our office. Our fax number is [phone].  If you have an urgent issue when the clinic is closed that cannot wait until the next business day, you can page your doctor at the number below.    Please note that while we do our best to be available for urgent issues outside of office hours, we  are not available 24/7.   If you have an urgent issue and are unable to reach [place], you may choose to seek medical care at your doctor's office, retail clinic, urgent care center, or emergency room.  If you have a medical emergency, please immediately call 911 or go to the emergency department.  Pager Numbers  - Dr. Gwen Pounds: [phone]  - Dr. Roseanne Reno: [phone]  - Dr. Katrinka Blazing: [phone]   In the event of inclement weather, please call our main line at [phone] for an update on the status of any delays or closures.  Dermatology Medication Tips: Please keep the boxes that  topical medications come in in order to help keep track of the instructions about where and how to use these. Pharmacies typically print the medication instructions only on the boxes and not directly on the medication tubes.   If your medication is too expensive, please contact our office at [phone] option 4 or send [place] a message through MyChart.   We are unable to tell what your co-pay for medications will be in advance as this is different depending on your insurance coverage. However, we may be able to find a substitute medication at lower cost or fill out paperwork to get insurance to cover a needed medication.   If a prior authorization is required to get your medication covered by your insurance company, please allow [place] 1-2 business days to complete this process.  Drug prices often vary depending on where the prescription is filled and some pharmacies may offer cheaper prices.  The website www.goodrx.com contains coupons for medications through different pharmacies. The prices here do not account for what the cost may be with help from insurance (it may be cheaper with your insurance), but the website can give you the price if you did not use any insurance.  - You can print the associated coupon and take it with your prescription to the pharmacy.  - You may also stop by our office during regular business hours and pick up a GoodRx coupon card.  - If you need your prescription sent electronically to a different pharmacy, notify our office through [facility] or by phone at [phone] option 4.     Si Usted Necesita Algo Despus de Su Visita  Tambin puede enviarnos un mensaje a travs de Clinical cytogeneticist. Por lo general respondemos a los mensajes de MyChart en el transcurso de 1 a 2 das hbiles.  Para renovar recetas, por favor pida a su farmacia que se ponga en contacto con nuestra oficina. Annie Sable de fax es [place] [phone].  Si tiene un asunto urgente cuando la clnica  est cerrada y que no puede esperar hasta el siguiente da hbil, puede llamar/localizar a su doctor(a) al nmero que aparece a continuacin.   Por favor, tenga en cuenta que aunque hacemos todo lo posible para estar disponibles para asuntos urgentes fuera del horario de [place], no estamos disponibles las 24 horas del da, los 7 [address]  [address] de la [place].   Si tiene un problema urgente y no puede comunicarse con nosotros, puede optar por buscar atencin mdica  en el consultorio de su doctor(a), en una clnica privada, en un centro de atencin urgente o en una sala de emergencias.  Si tiene Engineer, drilling, por favor llame inmediatamente al 911 o vaya a la sala de emergencias.  Nmeros de bper  - Dr. Gwen Pounds: [phone]  - Dra. Roseanne Reno: [phone]  - Dr. Katrinka Blazing: [phone]   En caso de inclemencias del  tiempo, por favor llame a [place]ne [place] lnea principal al [phone] para una actualizacin sobre el [place] de cualquier retraso o cierre.  Consejos para la medicacin en dermatologa: Por favor, guarde las cajas en las que vienen los medicamentos de uso tpico para ayudarle a seguir las instrucciones sobre dnde y cmo usarlos. Las farmacias generalmente imprimen las instrucciones del medicamento slo en las cajas y no directamente en los tubos del [place].   Si su medicamento es muy caro, por favor, pngase en contacto con Rolm Gala llamando al [phone] y presione la opcin 4 o envenos un mensaje a travs de Clinical cytogeneticist.   No podemos decirle cul ser su copago por los medicamentos por adelantado ya que esto es diferente dependiendo de la cobertura de su seguro. Sin embargo, es posible que podamos encontrar un medicamento sustituto a Audiological scientist un formulario para que el seguro cubra el medicamento que se considera necesario.   Si se requiere una autorizacin previa para que su compaa de seguros [place] su medicamento, por favor permtanos de 1 a 2 das hbiles para  completar [address]  Los precios de los medicamentos varan con frecuencia dependiendo del Environmental consultant de dnde se surte la receta y alguna farmacias pueden ofrecer precios ms baratos.  El sitio web www.goodrx.com tiene cupones para medicamentos de Health and safety inspector. Los precios aqu no tienen en cuenta lo que podra costar con la ayuda del seguro (puede ser ms barato con su seguro), pero el sitio web puede darle el precio si no utiliz Tourist information centre manager.  - Puede imprimir el cupn correspondiente y llevarlo con su receta a la farmacia.  - Tambin puede pasar por nuestra oficina durante el horario de atencin regular y Education officer, museum una tarjeta de cupones de GoodRx.  - Si necesita que su receta se enve electrnicamente a una farmacia diferente, informe a nuestra oficina a travs de MyChart de [place] o por telfono llamando al [phone] y presione la opcin 4.

## 2023-08-08 ENCOUNTER — Other Ambulatory Visit: Payer: Self-pay

## 2023-08-08 ENCOUNTER — Other Ambulatory Visit: Payer: Self-pay | Admitting: Internal Medicine

## 2023-08-08 MED ORDER — AMPHETAMINE-DEXTROAMPHETAMINE 15 MG PO TABS
15.0000 mg | ORAL_TABLET | Freq: Two times a day (BID) | ORAL | 0 refills | Status: DC
  Filled 2023-08-08: qty 60, 30d supply, fill #0

## 2023-08-11 ENCOUNTER — Other Ambulatory Visit: Payer: Self-pay

## 2023-09-08 DIAGNOSIS — N96 Recurrent pregnancy loss: Secondary | ICD-10-CM | POA: Diagnosis not present

## 2024-01-12 DIAGNOSIS — Z01411 Encounter for gynecological examination (general) (routine) with abnormal findings: Secondary | ICD-10-CM | POA: Diagnosis not present

## 2024-01-12 DIAGNOSIS — Z1331 Encounter for screening for depression: Secondary | ICD-10-CM | POA: Diagnosis not present

## 2024-01-12 DIAGNOSIS — Z1231 Encounter for screening mammogram for malignant neoplasm of breast: Secondary | ICD-10-CM | POA: Diagnosis not present

## 2024-01-12 DIAGNOSIS — Z124 Encounter for screening for malignant neoplasm of cervix: Secondary | ICD-10-CM | POA: Diagnosis not present

## 2024-01-12 DIAGNOSIS — Z113 Encounter for screening for infections with a predominantly sexual mode of transmission: Secondary | ICD-10-CM | POA: Diagnosis not present

## 2024-01-12 DIAGNOSIS — Z01419 Encounter for gynecological examination (general) (routine) without abnormal findings: Secondary | ICD-10-CM | POA: Diagnosis not present

## 2024-01-19 ENCOUNTER — Other Ambulatory Visit: Payer: Self-pay

## 2024-01-19 ENCOUNTER — Other Ambulatory Visit: Payer: Self-pay | Admitting: Internal Medicine

## 2024-01-19 MED ORDER — AMPHETAMINE-DEXTROAMPHETAMINE 15 MG PO TABS
15.0000 mg | ORAL_TABLET | Freq: Two times a day (BID) | ORAL | 0 refills | Status: AC
Start: 2024-01-19 — End: ?
  Filled 2024-01-19: qty 60, 30d supply, fill #0

## 2024-01-21 ENCOUNTER — Other Ambulatory Visit: Payer: Self-pay

## 2024-01-23 ENCOUNTER — Other Ambulatory Visit: Payer: Self-pay

## 2024-02-06 ENCOUNTER — Encounter: Payer: Self-pay | Admitting: Internal Medicine

## 2024-02-06 DIAGNOSIS — G5702 Lesion of sciatic nerve, left lower limb: Secondary | ICD-10-CM

## 2024-03-15 ENCOUNTER — Other Ambulatory Visit: Payer: Self-pay

## 2024-03-15 DIAGNOSIS — M7918 Myalgia, other site: Secondary | ICD-10-CM | POA: Diagnosis not present

## 2024-03-15 DIAGNOSIS — H6983 Other specified disorders of Eustachian tube, bilateral: Secondary | ICD-10-CM | POA: Diagnosis not present

## 2024-03-15 DIAGNOSIS — H9311 Tinnitus, right ear: Secondary | ICD-10-CM | POA: Diagnosis not present

## 2024-03-15 DIAGNOSIS — M1612 Unilateral primary osteoarthritis, left hip: Secondary | ICD-10-CM | POA: Diagnosis not present

## 2024-03-15 DIAGNOSIS — M5416 Radiculopathy, lumbar region: Secondary | ICD-10-CM | POA: Diagnosis not present

## 2024-03-15 MED ORDER — METHOCARBAMOL 500 MG PO TABS
250.0000 mg | ORAL_TABLET | Freq: Every evening | ORAL | 0 refills | Status: DC | PRN
  Filled 2024-03-15: qty 30, 30d supply, fill #0

## 2024-03-15 MED ORDER — TRIAMCINOLONE ACETONIDE 55 MCG/ACT NA AERO
2.0000 | INHALATION_SPRAY | Freq: Every day | NASAL | 12 refills | Status: AC
  Filled 2024-03-15: qty 16.9, 30d supply, fill #0
  Filled 2024-04-18: qty 16.9, 30d supply, fill #1

## 2024-03-30 ENCOUNTER — Other Ambulatory Visit: Payer: Self-pay | Admitting: Internal Medicine

## 2024-03-30 MED ORDER — PREDNISONE 20 MG PO TABS: ORAL_TABLET | ORAL | 0 refills | Status: AC

## 2024-04-01 ENCOUNTER — Other Ambulatory Visit: Payer: Self-pay | Admitting: Family Medicine

## 2024-04-01 DIAGNOSIS — M5416 Radiculopathy, lumbar region: Secondary | ICD-10-CM

## 2024-04-17 ENCOUNTER — Ambulatory Visit
Admission: RE | Admit: 2024-04-17 | Discharge: 2024-04-17 | Disposition: A | Source: Ambulatory Visit | Attending: Family Medicine | Admitting: Family Medicine

## 2024-04-17 DIAGNOSIS — M545 Low back pain, unspecified: Secondary | ICD-10-CM | POA: Diagnosis not present

## 2024-04-17 DIAGNOSIS — M5416 Radiculopathy, lumbar region: Secondary | ICD-10-CM

## 2024-04-18 ENCOUNTER — Other Ambulatory Visit: Payer: Self-pay

## 2024-04-19 ENCOUNTER — Other Ambulatory Visit: Payer: Self-pay

## 2024-04-21 ENCOUNTER — Other Ambulatory Visit: Payer: Self-pay

## 2024-04-21 DIAGNOSIS — M1612 Unilateral primary osteoarthritis, left hip: Secondary | ICD-10-CM | POA: Diagnosis not present

## 2024-04-21 DIAGNOSIS — M5416 Radiculopathy, lumbar region: Secondary | ICD-10-CM | POA: Diagnosis not present

## 2024-04-21 DIAGNOSIS — M7072 Other bursitis of hip, left hip: Secondary | ICD-10-CM | POA: Diagnosis not present

## 2024-04-23 DIAGNOSIS — M7072 Other bursitis of hip, left hip: Secondary | ICD-10-CM | POA: Diagnosis not present

## 2024-05-17 ENCOUNTER — Ambulatory Visit: Admitting: Internal Medicine

## 2024-05-17 ENCOUNTER — Encounter: Payer: Self-pay | Admitting: Internal Medicine

## 2024-05-17 VITALS — BP 118/70 | Resp 18

## 2024-05-17 DIAGNOSIS — N939 Abnormal uterine and vaginal bleeding, unspecified: Secondary | ICD-10-CM

## 2024-05-17 DIAGNOSIS — Z8742 Personal history of other diseases of the female genital tract: Secondary | ICD-10-CM

## 2024-05-17 DIAGNOSIS — M7072 Other bursitis of hip, left hip: Secondary | ICD-10-CM | POA: Diagnosis not present

## 2024-05-17 NOTE — Patient Instructions (Signed)
 Abnormal Uterine Bleeding    Abnormal uterine bleeding means bleeding more than normal from your womb (uterus). It can include:  Bleeding after sex.  Bleeding between monthly (menstrual) periods.  Bleeding that is heavier than normal.  Monthly periods that last longer than normal.  Bleeding after you have stopped having your monthly period (menopause).  You should see a doctor for any kind of bleeding that is not normal. Treatment depends on the cause of your bleeding and how much you bleed.  Follow these instructions at home:  Medicines  Take over-the-counter and prescription medicines only as told by your doctor.  Ask your doctor about:  Taking medicines such as aspirin and ibuprofen. Do not take these medicines unless your doctor tells you to take them.  Taking over-the-counter medicines, vitamins, herbs, and supplements.  You may be given iron pills. Take them as told by your doctor.  Managing constipation  If you take iron pills, you may need to take these actions to prevent or treat trouble pooping (constipation):  Drink enough fluid to keep your pee (urine) pale yellow.  Take over-the-counter or prescription medicines.  Eat foods that are high in fiber. These include beans, whole grains, and fresh fruits and vegetables.  Limit foods that are high in fat and sugar. These include fried or sweet foods.  Activity  Change your activity to decrease bleeding if you need to change your sanitary pad more than one time every 2 hours:  Lie in bed with your feet raised (elevated).  Place a cold pack on your lower belly.  Rest as much as you are able until the bleeding stops or slows down.  General instructions  Do not use tampons, douche, or have sex until your doctor says these things are okay.  Change your pads often.  Get regular exams. These include:  Pelvic exams.  Screenings for cancer of the cervix.  It is up to you to get the results of any tests that are done. Ask how to get your results when they are  ready.  Watch for any changes in your bleeding. For 2 months, write down:  When your monthly period starts.  When your monthly period ends.  When you get any abnormal bleeding from your vagina.  What problems you notice.  Keep all follow-up visits.  Contact a doctor if:  The bleeding lasts more than one week.  You feel dizzy at times.  You feel like you may vomit (nausea).  You vomit.  You feel light-headed or weak.  Your symptoms get worse.  Get help right away if:  You faint.  You have to change pads every hour.  You have pain in your belly.  You have a fever or chills.  You get sweaty or weak.  You pass large blood clots from your vagina.  These symptoms may be an emergency. Get help right away. Call your local emergency services (911 in the [place]  Do not wait to see if the symptoms will go away.  Do not drive yourself to the hospital.  Summary  Abnormal uterine bleeding means bleeding more than normal from your womb (uterus).  Any kind of bleeding that is not normal should be checked by a doctor.  Treatment depends on the cause of your bleeding and how much you bleed.  Get help right away if you faint, you have to change pads every hour, or you pass large blood clots from your vagina.  This information is not intended to replace  advice given to you by your health care provider. Make sure you discuss any questions you have with your health care provider.  Document Revised: 10/24/2020 Document Reviewed: 10/24/2020  Elsevier Patient Education  2024 [employer].

## 2024-05-17 NOTE — Progress Notes (Signed)
 Subjective:    Patient ID: Maria Cohen, female    DOB: [birth year], 38 y.o.   MRN: [id number]  HPI  Discussed the use of AI scribe software for clinical note transcription with the patient, who gave verbal consent to proceed.  Maria Cohen is a 38 year old female G1 P0-0-1-0, who presents with abnormal uterine bleeding and nausea.  She began experiencing daily spotting on October 6th after returning from [place]. Her normal menstrual period occurred from October 17th to 21st, followed by a few days without bleeding, and then resumed spotting until around October 25th or 26th.  While traveling to [place], she experienced heavy bleeding from October 26th to November 2nd, described as moderately heavy and requiring tampons or pads. The bleeding stopped approximately two to three days prior to the current visit.  She has recent episodes of nausea and easy fatigue. Two pregnancy tests were negative. Her last ultrasound was in November of the previous year, and her last TSH test was in March of the current year, with no history of thyroid  issues. She has not been on any hormones or injections, except last year when she was undergoing infertility treatment.  No significant pelvic pain but cramping similar to menstrual cramps. No history of hemorrhagic cysts or abnormal findings on prior ultrasounds. Her last Pap smear was normal, conducted in July of the current year, alongside a normal mammogram.    Review of Systems     Past Medical History:  Diagnosis Date   Chronic constipation    Raynaud's disease     Current Outpatient Medications  Medication Sig Dispense Refill   amphetamine -dextroamphetamine  (ADDERALL) 15 MG tablet Take 1 tablet by mouth 2 (two) times daily. 60 tablet 0   clindamycin  (CLEOCIN -T) 1 % lotion Apply to affected areas daily after shaving. 60 mL 11   methocarbamol  (ROBAXIN ) 500 MG tablet Take 0.5-1 tablets (250-500 mg total) by mouth at bedtime as  needed. 30 tablet 0   predniSONE  (DELTASONE ) 20 MG tablet Take 2 tabs PO Qam and Qpm x 3 days, take 1 tab PO Qam and Qpm x 3 days, take 1/2 tab PO Qam and Qpm x 3 days 21 tablet 0   triamcinolone  (NASACORT ) 55 MCG/ACT AERO nasal inhaler Place 2 sprays into the nose daily. 16.9 mL 12   No current facility-administered medications for this visit.    No Known Allergies  Family History  Problem Relation Age of Onset   Osteopenia Mother    COPD Father    Lung cancer Father    Breast cancer Maternal Grandmother 73   Kidney disease Maternal Grandmother     Social History   Socioeconomic History   Marital status: Married    Spouse name: Not on file   Number of children: Not on file   Years of education: Not on file   Highest education level: Not on file  Occupational History   Not on file  Tobacco Use   Smoking status: Never   Smokeless tobacco: Never  Vaping Use   Vaping status: Never Used  Substance and Sexual Activity   Alcohol use: Yes    Comment: ocasionally   Drug use: No   Sexual activity: Yes    Birth control/protection: None  Other Topics Concern   Not on file  Social History Narrative   Not on file   Social Drivers of Health   Financial Resource Strain: Low Risk  (03/15/2024)   Received from [facility]  System   Overall Financial Resource Strain (CARDIA)    Difficulty of Paying Living Expenses: Not hard at all  Food Insecurity: No Food Insecurity (03/15/2024)   Received from [facility]   Hunger Vital Sign    Within the past 12 months, you worried that your food would run out before you got the money to buy more.: Never true    Within the past 12 months, the food you bought just didn't last and you didn't have money to get more.: Never true  Transportation Needs: No Transportation Needs (03/15/2024)   Received from [facility] - Transportation    In the past 12 months, has lack of transportation kept you  from medical appointments or from getting medications?: No    Lack of Transportation (Non-Medical): No  Physical Activity: Not on file  Stress: Not on file  Social Connections: Not on file  Intimate Partner Violence: Not on file     Constitutional: Patient reports fatigue.  Denies fever, malaise, headache or abrupt weight changes.  HEENT: Denies eye pain, eye redness, ear pain, ringing in the ears, wax buildup, runny nose, nasal congestion, bloody nose, or sore throat. Respiratory: Denies difficulty breathing, shortness of breath, cough or sputum production.   Cardiovascular: Denies chest pain, chest tightness, palpitations or swelling in the hands or feet.  Gastrointestinal: Pt reports nausea, chronic constipation. Denies abdominal pain, bloating, diarrhea or blood in the stool.  GU: Patient reports abnormal vaginal bleeding, pelvic cramping.  Denies urgency, frequency, pain with urination, burning sensation, blood in urine, odor or discharge. Musculoskeletal: Denies decrease in range of motion, difficulty with gait, muscle pain or joint pain and swelling.  Skin: Denies redness, rashes, lesions or ulcercations.  Neurological: Denies dizziness, difficulty with memory, difficulty with speech or problems with balance and coordination.  Psych: Denies anxiety, depression, SI/HI.  No other specific complaints in a complete review of systems (except as listed in HPI above).  Objective:   Physical Exam BP 118/70   Resp 18   SpO2 100%     Wt Readings from Last 3 Encounters:  12/23/22 146 lb (66.2 kg)  09/23/22 146 lb (66.2 kg)  07/30/21 143 lb (64.9 kg)    General: Appears her stated age, well developed, well nourished in NAD. Skin: Warm, dry and intact.  Cardiovascular: Normal rate. Pulmonary/Chest: Normal effort. No respiratory distress.  Pelvic: Deferred. Neurological: Alert and oriented.   BMET    Component Value Date/Time   NA 137 04/01/2019 1050   NA 141 01/21/2019 1527    K 5.0 04/01/2019 1050   CL 102 04/01/2019 1050   CO2 25 04/01/2019 1050   GLUCOSE 100 (H) 04/01/2019 1050   BUN 16 04/01/2019 1050   BUN 10 01/21/2019 1527   CREATININE 0.92 04/01/2019 1050   CALCIUM 9.9 04/01/2019 1050   GFRNONAA 81 04/01/2019 1050   GFRAA 93 04/01/2019 1050    Lipid Panel     Component Value Date/Time   CHOL 192 04/01/2019 1050   CHOL 206 (H) 01/21/2019 1527   TRIG 64 04/01/2019 1050   HDL 86 04/01/2019 1050   HDL 88 01/21/2019 1527   CHOLHDL 2.2 04/01/2019 1050   LDLCALC 91 04/01/2019 1050    CBC    Component Value Date/Time   WBC 7.1 04/01/2019 1050   RBC 3.93 04/01/2019 1050   HGB 12.8 04/01/2019 1050   HCT 37.3 04/01/2019 1050   PLT 365 04/01/2019 1050   MCV  94.9 04/01/2019 1050   MCH 32.6 04/01/2019 1050   MCHC 34.3 04/01/2019 1050   RDW 12.6 04/01/2019 1050   LYMPHSABS 2,329 04/01/2019 1050   EOSABS 50 04/01/2019 1050   BASOSABS 99 04/01/2019 1050    Hgb A1C No results found for: HGBA1C          Assessment & Plan:    Assessment and Plan    Abnormal uterine bleeding Intermittent spotting and heavy bleeding resolved. Thyroid  issues and pregnancy ruled out. Differential includes fibroids and stress. - Ordered pelvic ultrasound to evaluate for fibroids or other abnormalities. -Consider TSH, metabolic panel and serum hCG however she would like to hold off at this time - If ultrasound is normal, monitor symptoms over the next few months.    RTC in 4 months for your annual exam Angeline Laura, NP

## 2024-05-18 ENCOUNTER — Ambulatory Visit

## 2024-05-24 ENCOUNTER — Ambulatory Visit
Admission: RE | Admit: 2024-05-24 | Discharge: 2024-05-24 | Disposition: A | Discharge status: Home / Self Care | Source: Ambulatory Visit | Attending: Internal Medicine | Admitting: Internal Medicine

## 2024-05-24 DIAGNOSIS — N83291 Other ovarian cyst, right side: Secondary | ICD-10-CM | POA: Diagnosis not present

## 2024-05-24 DIAGNOSIS — N939 Abnormal uterine and vaginal bleeding, unspecified: Secondary | ICD-10-CM | POA: Diagnosis not present

## 2024-06-25 ENCOUNTER — Other Ambulatory Visit: Payer: Self-pay | Admitting: Medical Genetics

## 2024-06-26 ENCOUNTER — Other Ambulatory Visit
Admission: RE | Admit: 2024-06-26 | Discharge: 2024-06-26 | Disposition: A | Discharge status: Home / Self Care | Payer: Self-pay | Source: Ambulatory Visit | Attending: Medical Genetics | Admitting: Medical Genetics

## 2024-07-13 LAB — GENECONNECT MOLECULAR SCREEN: Genetic Analysis Overall Interpretation: NEGATIVE

## 2024-08-09 ENCOUNTER — Encounter: Payer: 59 | Admitting: Dermatology

## 2024-08-30 ENCOUNTER — Encounter: Admitting: Dermatology
